# Patient Record
Sex: Male | Born: 1955 | Race: White | Hispanic: No | Marital: Married | Smoking: Current some day smoker
Health system: Southern US, Community
[De-identification: ages and names within clinical notes are randomized; demographics above are authoritative.]

## PROBLEM LIST (undated history)

## (undated) DIAGNOSIS — I10 Essential (primary) hypertension: Secondary | ICD-10-CM

## (undated) DIAGNOSIS — R7301 Impaired fasting glucose: Secondary | ICD-10-CM

## (undated) DIAGNOSIS — F419 Anxiety disorder, unspecified: Secondary | ICD-10-CM

## (undated) DIAGNOSIS — F329 Major depressive disorder, single episode, unspecified: Secondary | ICD-10-CM

## (undated) DIAGNOSIS — M1990 Unspecified osteoarthritis, unspecified site: Secondary | ICD-10-CM

## (undated) DIAGNOSIS — G2581 Restless legs syndrome: Secondary | ICD-10-CM

## (undated) DIAGNOSIS — R12 Heartburn: Secondary | ICD-10-CM

## (undated) DIAGNOSIS — Z87442 Personal history of urinary calculi: Secondary | ICD-10-CM

## (undated) DIAGNOSIS — K219 Gastro-esophageal reflux disease without esophagitis: Secondary | ICD-10-CM

## (undated) DIAGNOSIS — K635 Polyp of colon: Secondary | ICD-10-CM

## (undated) DIAGNOSIS — F32A Depression, unspecified: Secondary | ICD-10-CM

## (undated) DIAGNOSIS — R42 Dizziness and giddiness: Secondary | ICD-10-CM

## (undated) HISTORY — DX: Depression, unspecified: F32.A

## (undated) HISTORY — DX: Restless legs syndrome: G25.81

## (undated) HISTORY — DX: Essential (primary) hypertension: I10

## (undated) HISTORY — DX: Unspecified osteoarthritis, unspecified site: M19.90

## (undated) HISTORY — DX: Major depressive disorder, single episode, unspecified: F32.9

## (undated) HISTORY — DX: Polyp of colon: K63.5

## (undated) HISTORY — DX: Personal history of urinary calculi: Z87.442

## (undated) HISTORY — DX: Heartburn: R12

## (undated) HISTORY — DX: Gastro-esophageal reflux disease without esophagitis: K21.9

## (undated) HISTORY — DX: Impaired fasting glucose: R73.01

## (undated) HISTORY — DX: Anxiety disorder, unspecified: F41.9

---

## 2010-04-29 ENCOUNTER — Emergency Department: Payer: Self-pay | Admitting: Emergency Medicine

## 2010-07-01 ENCOUNTER — Emergency Department: Payer: Self-pay | Admitting: Internal Medicine

## 2010-07-20 ENCOUNTER — Ambulatory Visit: Payer: Self-pay | Admitting: Gastroenterology

## 2010-07-24 LAB — PATHOLOGY REPORT
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
## 2010-10-29 HISTORY — PX: UPPER GI ENDOSCOPY: SHX6162

## 2010-10-29 HISTORY — PX: COLONOSCOPY: SHX174

## 2015-01-19 ENCOUNTER — Encounter: Payer: Self-pay | Admitting: *Deleted

## 2015-01-27 ENCOUNTER — Ambulatory Visit (INDEPENDENT_AMBULATORY_CARE_PROVIDER_SITE_OTHER): Payer: BC Managed Care – PPO | Admitting: General Surgery

## 2015-01-27 ENCOUNTER — Encounter: Payer: Self-pay | Admitting: General Surgery

## 2015-01-27 VITALS — BP 162/88 | HR 88 | Resp 16 | Ht 73.0 in | Wt 194.0 lb

## 2015-01-27 DIAGNOSIS — K4090 Unilateral inguinal hernia, without obstruction or gangrene, not specified as recurrent: Secondary | ICD-10-CM | POA: Diagnosis not present

## 2015-01-27 NOTE — Progress Notes (Signed)
Patient ID: Philip Lynch, male   DOB: 1956/10/24, 59 y.o.   MRN: [id number]  Chief Complaint  Patient presents with  . Hernia    HPI Philip Lynch is a 59 y.o. male.  Here today for evaluation of a possible right inguinal hernia. He states he does a lot of heavy lifting and he has noticed a bulge for about 3-4 months. It just seems to be a little more tender and uncomfortable. He does feel a "pulling" sensation in the right lower abdomen. No nausea or vomiting.  HPI  Past Medical History  Diagnosis Date  . Colon polyp   . Arthritis   . Hypertension     Past Surgical History  Procedure Laterality Date  . Colonoscopy  2012    Dr. Tobias Alexander  . Upper gi endoscopy  2012    Family History  Problem Relation Age of Onset  . COPD Mother   . Heart attack Father     Social History History  Substance Use Topics  . Smoking status: Current Every Day Smoker -- 1.00 packs/day for 30 years    Types: Cigarettes  . Smokeless tobacco: Never Used  . Alcohol Use: No    No Known Allergies  Current Outpatient Prescriptions  Medication Sig Dispense Refill  . losartan (COZAAR) 100 MG tablet Take 100 mg by mouth daily.  6  . omeprazole (PRILOSEC) 20 MG capsule Take 20 mg by mouth daily.  6   No current facility-administered medications for this visit.    Review of Systems Review of Systems  Constitutional: Negative.   Respiratory: Negative.   Cardiovascular: Negative.     Blood pressure 162/88, pulse 88, resp. rate 16, height 6\' 1"  (1.854 m), weight 194 lb (87.998 kg).  Physical Exam Physical Exam  Constitutional: He is oriented to person, place, and time. He appears well-developed and well-nourished.  Eyes: Conjunctivae are normal. No scleral icterus.  Neck: Neck supple.  Cardiovascular: Normal rate, regular rhythm and normal heart sounds.   Pulmonary/Chest: Effort normal and breath sounds normal.  Abdominal: Soft. Normal appearance and bowel sounds are normal. A hernia is  present. Hernia confirmed positive in the right inguinal area.  Lymphadenopathy:    He has no cervical adenopathy.  Neurological: He is alert and oriented to person, place, and time.  Skin: Skin is warm and dry.    Data Reviewed none  Assessment    Right inguinal hernia. There is subtle fullness in left groin-not sure it is a hernia    Plan    Hernia precautions and incarceration were discussed with the patient. If they develop symptoms of an incarcerated hernia, they were encouraged to seek prompt medical attention.  I have recommended repair of the hernia using mesh on an outpatient basis in the near future. The risk of infection was reviewed. The role of prosthetic mesh to minimize the risk of recurrence was reviewed.    Discussed open and laparoscopic technique . Feel laparoscopy may help assess the left side also. Pt is agreeable. PCP:  Lydia Guiles G 01/27/2015, 10:00 AM

## 2015-01-27 NOTE — Patient Instructions (Addendum)
The patient is aware to call back for any questions or concerns. Inguinal Hernia, Adult Muscles help keep everything in the body in its proper place. But if a weak spot in the muscles develops, something can poke through. That is called a hernia. When this happens in the lower part of the belly (abdomen), it is called an inguinal hernia. (It takes its name from a part of the body in this region called the inguinal canal.) A weak spot in the wall of muscles lets some fat or part of the small intestine bulge through. An inguinal hernia can develop at any age. Men get them more often than women. CAUSES  In adults, an inguinal hernia develops over time.  It can be triggered by:  Suddenly straining the muscles of the lower abdomen.  Lifting heavy objects.  Straining to have a bowel movement. Difficult bowel movements (constipation) can lead to this.  Constant coughing. This may be caused by smoking or lung disease.  Being overweight.  Being pregnant.  Working at a job that requires long periods of standing or heavy lifting.  Having had an inguinal hernia before. One type can be an emergency situation. It is called a strangulated inguinal hernia. It develops if part of the small intestine slips through the weak spot and cannot get back into the abdomen. The blood supply can be cut off. If that happens, part of the intestine may die. This situation requires emergency surgery. SYMPTOMS  Often, a small inguinal hernia has no symptoms. It is found when a healthcare provider does a physical exam. Larger hernias usually have symptoms.   In adults, symptoms may include:  A lump in the groin. This is easier to see when the person is standing. It might disappear when lying down.  In men, a lump in the scrotum.  Pain or burning in the groin. This occurs especially when lifting, straining or coughing.  A dull ache or feeling of pressure in the groin.  Signs of a strangulated hernia can  include:  A bulge in the groin that becomes very painful and tender to the touch.  A bulge that turns red or purple.  Fever, nausea and vomiting.  Inability to have a bowel movement or to pass gas. DIAGNOSIS  To decide if you have an inguinal hernia, a healthcare provider will probably do a physical examination.  This will include asking questions about any symptoms you have noticed.  The healthcare provider might feel the groin area and ask you to cough. If an inguinal hernia is felt, the healthcare provider may try to slide it back into the abdomen.  Usually no other tests are needed. TREATMENT  Treatments can vary. The size of the hernia makes a difference. Options include:  Watchful waiting. This is often suggested if the hernia is small and you have had no symptoms.  No medical procedure will be done unless symptoms develop.  You will need to watch closely for symptoms. If any occur, contact your healthcare provider right away.  Surgery. This is used if the hernia is larger or you have symptoms.  Open surgery. This is usually an outpatient procedure (you will not stay overnight in a hospital). An cut (incision) is made through the skin in the groin. The hernia is put back inside the abdomen. The weak area in the muscles is then repaired by herniorrhaphy or hernioplasty. Herniorrhaphy: in this type of surgery, the weak muscles are sewn back together. Hernioplasty: a patch or mesh is   is used to close the weak area in the abdominal wall.  Laparoscopy. In this procedure, a surgeon makes small incisions. A thin tube with a tiny video camera (called a laparoscope) is put into the abdomen. The surgeon repairs the hernia with mesh by looking with the video camera and using two long instruments. HOME CARE INSTRUCTIONS   After surgery to repair an inguinal hernia:  You will need to take pain medicine prescribed by your healthcare provider. Follow all directions carefully.  You will need  to take care of the wound from the incision.  Your activity will be restricted for awhile. This will probably include no heavy lifting for several weeks. You also should not do anything too active for a few weeks. When you can return to work will depend on the type of job that you have.  During "watchful waiting" periods, you should:  Maintain a healthy weight.  Eat a diet high in fiber (fruits, vegetables and whole grains).  Drink plenty of fluids to avoid constipation. This means drinking enough water and other liquids to keep your urine clear or pale yellow.  Do not lift heavy objects.  Do not stand for long periods of time.  Quit smoking. This should keep you from developing a frequent cough. SEEK MEDICAL CARE IF:   A bulge develops in your groin area.  You feel pain, a burning sensation or pressure in the groin. This might be worse if you are lifting or straining.  You develop a fever of more than 100.5 F (38.1 C). SEEK IMMEDIATE MEDICAL CARE IF:   Pain in the groin increases suddenly.  A bulge in the groin gets bigger suddenly and does not go down.  For men, there is sudden pain in the scrotum. Or, the size of the scrotum increases.  A bulge in the groin area becomes red or purple and is painful to touch.  You have nausea or vomiting that does not go away.  You feel your heart beating much faster than normal.  You cannot have a bowel movement or pass gas.  You develop a fever of more than 102.0 F (38.9 C). Document Released: 03/03/2009 Document Revised: 01/07/2012 Document Reviewed: 03/03/2009 [facility] Patient Information 2015 [place], [place]. This information is not intended to replace advice given to you by your health care provider. Make sure you discuss any questions you have with your health care provider.  Patient's surgery has been scheduled for 02-03-15 at [facility].

## 2015-01-28 LAB — CBC WITH DIFFERENTIAL/PLATELET
BASOS: 1 %
Basophils Absolute: 0.1 10*3/uL (ref 0.0–0.2)
EOS: 1 %
Eosinophils Absolute: 0.1 10*3/uL (ref 0.0–0.4)
HCT: 44.6 % (ref 37.5–51.0)
Hemoglobin: 15.5 g/dL (ref 12.6–17.7)
IMMATURE GRANS (ABS): 0 10*3/uL (ref 0.0–0.1)
Immature Granulocytes: 0 %
LYMPHS: 20 %
Lymphocytes Absolute: 2.1 10*3/uL (ref 0.7–3.1)
MCH: 30.8 pg (ref 26.6–33.0)
MCHC: 34.8 g/dL (ref 31.5–35.7)
MCV: 89 fL (ref 79–97)
MONOCYTES: 8 %
Monocytes Absolute: 0.9 10*3/uL (ref 0.1–0.9)
NEUTROS PCT: 70 %
Neutrophils Absolute: 7.4 10*3/uL — ABNORMAL HIGH (ref 1.4–7.0)
Platelets: 303 10*3/uL (ref 150–379)
RBC: 5.03 x10E6/uL (ref 4.14–5.80)
RDW: 13.4 % (ref 12.3–15.4)
WBC: 10.5 10*3/uL (ref 3.4–10.8)

## 2015-01-28 LAB — BASIC METABOLIC PANEL
BUN/Creatinine Ratio: 14 (ref 9–20)
BUN: 14 mg/dL (ref 6–24)
CHLORIDE: 100 mmol/L (ref 97–108)
CO2: 24 mmol/L (ref 18–29)
CREATININE: 0.98 mg/dL (ref 0.76–1.27)
Calcium: 10 mg/dL (ref 8.7–10.2)
GFR calc non Af Amer: 84 mL/min/{1.73_m2} (ref >59)
GFR, EST AFRICAN AMERICAN: 97 mL/min/{1.73_m2} (ref >59)
GLUCOSE: 109 mg/dL — AB (ref 65–99)
Potassium: 4.9 mmol/L (ref 3.5–5.2)
Sodium: 139 mmol/L (ref 134–144)

## 2015-01-30 ENCOUNTER — Telehealth: Payer: Self-pay | Admitting: General Surgery

## 2015-01-30 NOTE — Telephone Encounter (Signed)
The patient was seen on 01/27/2015 in regards to a suspected right inguinal hernia. Surgery is scheduled for April 7.  The patient called this evening reporting a little bit of soreness in the area and perhaps a little bit of swelling. He had had a fairly quiet day.  He denied any difficulty with bowel or bladder function and no nausea vomiting or pain.  Unless symptoms change, he may plan on presenting for elective hernia repair as previously scheduled. Should he develop new symptoms he was encouraged to call for assessment.

## 2015-01-31 ENCOUNTER — Encounter: Payer: Self-pay | Admitting: General Surgery

## 2015-01-31 ENCOUNTER — Ambulatory Visit
Admit: 2015-01-31 | Disposition: A | Discharge status: Home / Self Care | Payer: Self-pay | Attending: General Surgery | Admitting: General Surgery

## 2015-01-31 ENCOUNTER — Ambulatory Visit (INDEPENDENT_AMBULATORY_CARE_PROVIDER_SITE_OTHER): Payer: BC Managed Care – PPO | Admitting: General Surgery

## 2015-01-31 VITALS — BP 140/80 | HR 76 | Resp 14 | Ht 73.0 in | Wt 191.0 lb

## 2015-01-31 DIAGNOSIS — K4090 Unilateral inguinal hernia, without obstruction or gangrene, not specified as recurrent: Secondary | ICD-10-CM | POA: Diagnosis not present

## 2015-01-31 LAB — CBC WITH DIFFERENTIAL/PLATELET
BASOS ABS: 0.1 10*3/uL (ref 0.0–0.1)
Basophil %: 0.8 %
EOS ABS: 0.1 10*3/uL (ref 0.0–0.7)
Eosinophil %: 0.5 %
HCT: 46.4 % (ref 40.0–52.0)
HGB: 15.3 g/dL (ref 13.0–18.0)
LYMPHS PCT: 13.3 %
Lymphocyte #: 1.7 10*3/uL (ref 1.0–3.6)
MCH: 30 pg (ref 26.0–34.0)
MCHC: 32.9 g/dL (ref 32.0–36.0)
MCV: 91 fL (ref 80–100)
Monocyte #: 1 x10 3/mm (ref 0.2–1.0)
Monocyte %: 7.7 %
NEUTROS PCT: 77.7 %
Neutrophil #: 9.7 10*3/uL — ABNORMAL HIGH (ref 1.4–6.5)
Platelet: 288 10*3/uL (ref 150–440)
RBC: 5.1 10*6/uL (ref 4.40–5.90)
RDW: 12.5 % (ref 11.5–14.5)
WBC: 12.5 10*3/uL — AB (ref 3.8–10.6)

## 2015-01-31 NOTE — Progress Notes (Signed)
Patient ID: Philip Lynch, male   DOB: [birth year], 59 y.o.   MRN: [id number]  Chief Complaint  Patient presents with  . Follow-up    swelling at right inguinal hernia site    HPI Philip Lynch is a 59 y.o. male  Patient here due to swelling at his right inguinal hernia site. He is scheduled for surgical repair of this area on 02/03/15. He states that he felt some swelling in the area last night. He called and was told to put ice on the area and rest. Today he reports nausea, light headedness and sweating. He reports no fevers and is using the bathroom regularly. He does report pain and cramping in the area today.   HPI  Past Medical History  Diagnosis Date  . Colon polyp   . Arthritis   . Hypertension     Past Surgical History  Procedure Laterality Date  . Colonoscopy  2012    Dr. Tobias Alexander  . Upper gi endoscopy  2012    Family History  Problem Relation Age of Onset  . COPD Mother   . Heart attack Father     Social History History  Substance Use Topics  . Smoking status: Current Every Day Smoker -- 1.00 packs/day for 30 years    Types: Cigarettes  . Smokeless tobacco: Never Used  . Alcohol Use: No    No Known Allergies  Current Outpatient Prescriptions  Medication Sig Dispense Refill  . ibuprofen (ADVIL,MOTRIN) 200 MG tablet Take 200 mg by mouth every 6 (six) hours as needed.    Marland Kitchen losartan (COZAAR) 100 MG tablet Take 100 mg by mouth daily.  6  . omeprazole (PRILOSEC) 20 MG capsule Take 20 mg by mouth daily.  6   No current facility-administered medications for this visit.    Review of Systems Review of Systems  Constitutional: Negative.   Respiratory: Negative.   Cardiovascular: Negative.   Gastrointestinal: Negative.     Blood pressure 140/80, pulse 76, resp. rate 14, height 6\' 1"  (1.854 m), weight 191 lb (86.637 kg).  Physical Exam Physical Exam  Constitutional: He is oriented to person, place, and time. He appears well-developed and well-nourished.   Eyes: Conjunctivae are normal. No scleral icterus.  Neck: Neck supple.  Cardiovascular: Normal rate, regular rhythm and normal heart sounds.   Pulmonary/Chest: Effort normal and breath sounds normal.  Abdominal: Soft. Bowel sounds are normal. He exhibits distension (mildly distended with minimal tympany ). There is tenderness (right groin). A hernia is present. Hernia confirmed positive in the right inguinal area (unchanged from previous evaluation).  Neurological: He is alert and oriented to person, place, and time.    Data Reviewed Prior note  Assessment    Right inguinal hernia possible involvement of bowel.    Plan    Check labs and xray abdomen. Further management based on these results.     WBC is 12.5K, rest normal. Abdominal xray- no small or large bowel distension. Moderate amount of stool in colon. Since pt reports more pain in right inguinal area will plan to proceed with repair tomorrow. Pt is agreeable.  Advised to use couple of fleets enemas today and eat very light. Cal if pain worsens or he develops any more abd discomfort.   Jaedan Schuman G 01/31/2015, 4:34 PM

## 2015-01-31 NOTE — Patient Instructions (Signed)
We will call you with results. Nothing to eat or drink until you here from [place].

## 2015-01-31 NOTE — Progress Notes (Deleted)
Patient ID: Philip Lynch, male   DOB: [birth year], 59 y.o.   MRN: [id number] Patient here due to swelling at his right inguinal hernia site. He is scheduled for surgical repair of this area on 02/03/15. He states that he felt some swelling in the area last night. He called and was told to put ice on the area and rest. Today he reports nausea, light headedness and sweating. He reports no fevers and is using the bathroom regularly. He does report pain and cramping in the area today.

## 2015-02-01 ENCOUNTER — Ambulatory Visit
Admit: 2015-02-01 | Disposition: A | Discharge status: Home / Self Care | Payer: Self-pay | Attending: General Surgery | Admitting: General Surgery

## 2015-02-01 ENCOUNTER — Telehealth: Payer: Self-pay

## 2015-02-01 DIAGNOSIS — K4090 Unilateral inguinal hernia, without obstruction or gangrene, not specified as recurrent: Secondary | ICD-10-CM | POA: Diagnosis not present

## 2015-02-01 HISTORY — PX: HERNIA REPAIR: SHX51

## 2015-02-01 NOTE — Telephone Encounter (Signed)
Patient called concerned because he just urinated after his surgery today and had some stinging while urinating. He states that the stinging stopped when he was done and that he was just concerned if this was to be expected or not. I let him know that he had a urinary catheter placed for surgery and that he may have some mild discomfort with urination for a short while. Patient instructed to drink plenty of fluids and monitor his urination. He was instructed to call if the symptoms don't clear up by morning or if they get worse. Patient expresses understanding and is agreeable to this.

## 2015-02-02 ENCOUNTER — Encounter: Payer: Self-pay | Admitting: General Surgery

## 2015-02-04 ENCOUNTER — Telehealth: Payer: Self-pay

## 2015-02-04 NOTE — Telephone Encounter (Signed)
Patient called and states that he has a lot of gas and is still constipated. He states that he is no longer taking his Percocet. I advised him to take Milk of Magnesia, one dose today and then one dose tomorrow if needed. If he does not have any positive results from this he is to call and inform us. Patient expresses understanding.

## 2015-02-07 ENCOUNTER — Encounter: Payer: Self-pay | Admitting: General Surgery

## 2015-02-10 ENCOUNTER — Encounter: Payer: Self-pay | Admitting: General Surgery

## 2015-02-10 ENCOUNTER — Ambulatory Visit (INDEPENDENT_AMBULATORY_CARE_PROVIDER_SITE_OTHER): Payer: BC Managed Care – PPO | Admitting: General Surgery

## 2015-02-10 VITALS — BP 140/68 | HR 76 | Resp 12 | Ht 73.0 in | Wt 187.0 lb

## 2015-02-10 DIAGNOSIS — K4090 Unilateral inguinal hernia, without obstruction or gangrene, not specified as recurrent: Secondary | ICD-10-CM

## 2015-02-10 NOTE — Patient Instructions (Signed)
Patient to return in one month. 

## 2015-02-10 NOTE — Progress Notes (Signed)
This is a 59 year old male here today for his post op right inguinal hernia repair done on 02/01/15. Patient states he is doing well.  Right inguinal hernia repair intact and healing well. Abdomen is soft . Patient to return in one month.

## 2015-02-11 ENCOUNTER — Encounter: Payer: Self-pay | Admitting: General Surgery

## 2015-02-27 NOTE — Op Note (Signed)
PATIENT NAME:  Philip Lynch, Kingsly L MR#:  [id number] DATE OF BIRTH:  October 14, 1956  DATE OF PROCEDURE:  02/01/2015  PREOPERATIVE DIAGNOSIS: Right inguinal hernia.   POSTOPERATIVE DIAGNOSIS: Right inguinal direct hernia.   OPERATION PERFORMED: Laparoscopy and repair of right inguinal hernia.   ANESTHESIA: General.   COMPLICATIONS: None.   ESTIMATED BLOOD LOSS: Minimal, less than 20 mL.   DRAINS: None.   DESCRIPTION OF PROCEDURE: The patient was put to sleep in the supine position on the operating table. Foley catheter was inserted, which was removed at the end of the procedure. The abdomen was prepped and draped out as a sterile field and a timeout was performed. Initial port incision was made just above the umbilicus with a small incision and a Veress needle with the InnerDyne sleeve was positioned within the peritoneal cavity, verified with the hanging drop method. Pneumoperitoneum was obtained and a 10 mm port was placed. An angled scope camera was introduced and a left lateral 5 mm port and right lateral 12 mm ports were placed. The patient was placed in slight Trendelenburg and the bowel was displaced from the pelvic and lower abdominal area to reveal the inguinal regions. No hernia noted on the left. On the right side, the patient had a well-defined circular 2 cm hernial opening located on the medial portion of the inguinal canal into which the appendix was herniating. The appendix we easily reduced back to the peritoneal cavity and noted to be entirely normal. The peritoneum overlying the inguinal canal and superior to the inguinal canal was incised with cautery and then further dissection was performed to expose the posterior wall of the inguinal canal thereby reducing the hernia protrusion of peritoneum in the posterior wall. The fatty tissue surrounding this was also reduced leaving a large defect in the posterior wall medial to the inferior epigastric vessel. In the course of dissection, a small  branch coming from the inferior epigastric was noted to be bleeding. This was Hemoclipped and controlled easily. The pubic tubercle and the inguinal ligament both medial and lateral to the internal ring area were satisfactorily exposed. The contents in the internal ring were identified and peeled away to ensure there was no other abnormality at this site. After the dissection was complete, the posterior wall was well displayed. A medium-sized Bard 3-D Max mesh was then brought up. It was placed into peritoneal cavity and then placed up against the posterior wall of the inguinal canal. After satisfactory positioning, it was tacked to the pubic tubercle and the inguinal ligament below with a secure strap. It was also tacked in a few places medially and in the superior aspect with the lateral ends left long. Mesh was noted to be adequately covering the posterior wall and the internal ring area. The peritoneum was then reapproximated with also a secure strap to cover the mesh completely. The fascial opening of the umbilicus in the right side was closed with 0 Vicryl stitches and pneumoperitoneum was released through the left lateral port, which was then subsequently removed. Skin incisions were closed with subcuticular 4-0 Vicryl, covered with LiquiBand. The procedure was well tolerated. He was subsequently extubated and returned to the recovery room in stable condition.    ____________________________ S.Robinette Haines, MD sgs:at D: 02/01/2015 12:50:10 ET T: 02/01/2015 13:21:49 ET JOB#: [id number]  cc: S.G. Jamal Collin, MD, <Dictator> [facility] Robinette Haines MD ELECTRONICALLY SIGNED 02/01/2015 15:12

## 2015-03-10 ENCOUNTER — Ambulatory Visit (INDEPENDENT_AMBULATORY_CARE_PROVIDER_SITE_OTHER): Payer: BC Managed Care – PPO | Admitting: General Surgery

## 2015-03-10 ENCOUNTER — Encounter: Payer: Self-pay | Admitting: General Surgery

## 2015-03-10 VITALS — BP 128/84 | HR 80 | Resp 12 | Ht 73.0 in | Wt 187.0 lb

## 2015-03-10 DIAGNOSIS — K4090 Unilateral inguinal hernia, without obstruction or gangrene, not specified as recurrent: Secondary | ICD-10-CM

## 2015-03-10 NOTE — Progress Notes (Signed)
Here today for postoperative visit, laparoscopic hernia repair on 02-01-15. States he is doing well. Bowels moving regular Minimal pain described as "soreness". Only trouble is his back and wants to go to chiropractor.  Port sites are clean and healing well. Abdomen is soft. No hernia palpated.  May return to activities without restrictions next week.  Proper lifting techniques reviewed. Follow up as needed.

## 2015-03-10 NOTE — Patient Instructions (Addendum)
The patient is aware to call back for any questions or concerns. May return to activities without restrictions next week.  Proper lifting techniques reviewed.

## 2015-03-21 ENCOUNTER — Other Ambulatory Visit: Payer: Self-pay | Admitting: Internal Medicine

## 2015-03-21 DIAGNOSIS — R1011 Right upper quadrant pain: Secondary | ICD-10-CM

## 2015-03-25 ENCOUNTER — Ambulatory Visit: Payer: Self-pay

## 2015-03-29 ENCOUNTER — Other Ambulatory Visit: Payer: Self-pay | Admitting: Internal Medicine

## 2015-03-30 ENCOUNTER — Other Ambulatory Visit: Payer: Self-pay | Admitting: Internal Medicine

## 2015-03-30 DIAGNOSIS — R1011 Right upper quadrant pain: Secondary | ICD-10-CM

## 2015-03-30 DIAGNOSIS — Z8719 Personal history of other diseases of the digestive system: Secondary | ICD-10-CM

## 2015-03-30 DIAGNOSIS — Z9889 Other specified postprocedural states: Secondary | ICD-10-CM

## 2015-04-05 ENCOUNTER — Ambulatory Visit: Payer: Self-pay

## 2015-04-26 ENCOUNTER — Encounter: Payer: Self-pay | Admitting: Emergency Medicine

## 2015-04-26 ENCOUNTER — Emergency Department
Admission: EM | Admit: 2015-04-26 | Discharge: 2015-04-26 | Disposition: A | Discharge status: Home / Self Care | Payer: BC Managed Care – PPO | Attending: Emergency Medicine | Admitting: Emergency Medicine

## 2015-04-26 DIAGNOSIS — I10 Essential (primary) hypertension: Secondary | ICD-10-CM | POA: Diagnosis not present

## 2015-04-26 DIAGNOSIS — R42 Dizziness and giddiness: Secondary | ICD-10-CM | POA: Insufficient documentation

## 2015-04-26 DIAGNOSIS — Z79899 Other long term (current) drug therapy: Secondary | ICD-10-CM | POA: Diagnosis not present

## 2015-04-26 DIAGNOSIS — Z720 Tobacco use: Secondary | ICD-10-CM | POA: Diagnosis not present

## 2015-04-26 DIAGNOSIS — Z7951 Long term (current) use of inhaled steroids: Secondary | ICD-10-CM | POA: Insufficient documentation

## 2015-04-26 LAB — BASIC METABOLIC PANEL
ANION GAP: 6 (ref 5–15)
BUN: 14 mg/dL (ref 6–20)
CALCIUM: 9.5 mg/dL (ref 8.9–10.3)
CO2: 27 mmol/L (ref 22–32)
CREATININE: 0.8 mg/dL (ref 0.61–1.24)
Chloride: 106 mmol/L (ref 101–111)
GFR calc Af Amer: >60 mL/min (ref >60)
Glucose, Bld: 115 mg/dL — ABNORMAL HIGH (ref 65–99)
Potassium: 3.8 mmol/L (ref 3.5–5.1)
Sodium: 139 mmol/L (ref 135–145)

## 2015-04-26 LAB — CBC WITH DIFFERENTIAL/PLATELET
BASOS ABS: 0.1 10*3/uL (ref 0–0.1)
Basophils Relative: 2 %
EOS ABS: 0.2 10*3/uL (ref 0–0.7)
Eosinophils Relative: 2 %
HCT: 44 % (ref 40.0–52.0)
Hemoglobin: 14.9 g/dL (ref 13.0–18.0)
Lymphocytes Relative: 21 %
Lymphs Abs: 1.8 10*3/uL (ref 1.0–3.6)
MCH: 30.7 pg (ref 26.0–34.0)
MCHC: 33.8 g/dL (ref 32.0–36.0)
MCV: 90.8 fL (ref 80.0–100.0)
Monocytes Absolute: 0.8 10*3/uL (ref 0.2–1.0)
Monocytes Relative: 9 %
NEUTROS ABS: 5.8 10*3/uL (ref 1.4–6.5)
Neutrophils Relative %: 66 %
PLATELETS: 316 10*3/uL (ref 150–440)
RBC: 4.85 MIL/uL (ref 4.40–5.90)
RDW: 12.8 % (ref 11.5–14.5)
WBC: 8.7 10*3/uL (ref 3.8–10.6)

## 2015-04-26 LAB — TROPONIN I

## 2015-04-26 MED ORDER — MECLIZINE HCL 25 MG PO TABS
25.0000 mg | ORAL_TABLET | Freq: Once | ORAL | Status: AC
  Administered 2015-04-26: 25 mg via ORAL

## 2015-04-26 MED ORDER — DEXAMETHASONE SODIUM PHOSPHATE 10 MG/ML IJ SOLN
INTRAMUSCULAR | Status: AC
  Filled 2015-04-26: qty 1

## 2015-04-26 MED ORDER — MECLIZINE HCL 25 MG PO TABS
ORAL_TABLET | ORAL | Status: AC
  Filled 2015-04-26: qty 1

## 2015-04-26 MED ORDER — MECLIZINE HCL 25 MG PO TABS: 25.0000 mg | ORAL_TABLET | Freq: Three times a day (TID) | ORAL | Status: DC | PRN

## 2015-04-26 MED ORDER — DEXAMETHASONE SODIUM PHOSPHATE 10 MG/ML IJ SOLN
10.0000 mg | Freq: Once | INTRAMUSCULAR | Status: AC
  Administered 2015-04-26: 10 mg via INTRAVENOUS

## 2015-04-26 NOTE — ED Notes (Signed)
Pt presents with dizziness for the past couple of weeks. Pt c/o fatigue at times, c/o left sided facial twitching.  Pt had hernia surgery about three months ago and has not felt "right" since the surgery. Pt denies any chest pain or shortness of breath at this time. edp at bedside.  Pt does reports some feelings of discomfort on the right side lower quad.  vss.

## 2015-04-26 NOTE — ED Provider Notes (Signed)
[facility] Emergency Department Provider Note  ____________________________________________  Time seen: 7:45 AM  I have reviewed the triage vital signs and the nursing notes.   HISTORY  Chief Complaint Dizziness    HPI Philip Lynch is a 59 y.o. male who complains of episodic dizziness for the past 2-3 weeks. He notes that over the last 4 weeks she's been treated with 2 courses of antibiotics for upper respiratory infection including azithromycin and amoxicillin. Over the last 2-3 weeks, he is having episodes of dizziness described as primarily lightheadedness with sometimes motion, the symptoms appear to be mainly orthostatic in nature, worse when he is upright. He denies any exertional symptoms such as chest pain shortness of breath or lightheadedness. However he has had 2 episodes of dizziness that were onset after walking on a treadmill or chasing his dog around the house.No syncope. No headache, numbness tingling or weakness anywhere in the body, vision changes or hearing changes. No fevers or chills.  He is eating and drinking normally, urinating also times a day without problems. The dizziness does not happen every day and when it does it lasts for a few minutes to a few hours.  He also noted some twitching in the left side of his face, which comes and goes fleetingly.   Past Medical History  Diagnosis Date  . Colon polyp   . Arthritis   . Hypertension     There are no active problems to display for this patient.   Past Surgical History  Procedure Laterality Date  . Colonoscopy  2012    Dr. Tobias Alexander  . Upper gi endoscopy  2012  . Hernia repair Right 02/01/15    inguinal     Current Outpatient Rx  Name  Route  Sig  Dispense  Refill  . ferrous sulfate 325 (65 FE) MG tablet   Oral   Take 325 mg by mouth daily.      10   . fluticasone (FLONASE) 50 MCG/ACT nasal spray      USE 1 SPRAY IN EACH NOSTRIL EVERY DAY      0   . ibuprofen  (ADVIL,MOTRIN) 200 MG tablet   Oral   Take 200 mg by mouth every 6 (six) hours as needed.         Marland Kitchen losartan (COZAAR) 100 MG tablet   Oral   Take 100 mg by mouth daily.      6   . omeprazole (PRILOSEC) 20 MG capsule   Oral   Take 20 mg by mouth daily.      6   . sertraline (ZOLOFT) 50 MG tablet   Oral   Take 50 mg by mouth daily.      5   . meclizine (ANTIVERT) 25 MG tablet   Oral   Take 1 tablet (25 mg total) by mouth 3 (three) times daily as needed for dizziness or nausea.   30 tablet   1     Allergies Review of patient's allergies indicates no known allergies.  Family History  Problem Relation Age of Onset  . COPD Mother   . Heart attack Father     Social History History  Substance Use Topics  . Smoking status: Current Every Day Smoker -- 1.00 packs/day for 30 years    Types: Cigarettes  . Smokeless tobacco: Never Used  . Alcohol Use: No    Review of Systems  Constitutional: No fever or chills. No weight changes Eyes:No blurry vision or double  vision.  ENT: No sore throat. Cardiovascular: No chest pain. Respiratory: No dyspnea or cough. Gastrointestinal: Negative for abdominal pain, vomiting and diarrhea.  No BRBPR or melena. Genitourinary: Negative for dysuria, urinary retention, bloody urine, or difficulty urinating. Musculoskeletal: Negative for back pain. No joint swelling or pain. Skin: Negative for rash. Neurological: Negative for headaches, focal weakness or numbness. Positive dizziness as above Psychiatric:No anxiety or depression.   Endocrine:No hot/cold intolerance, changes in energy, or sleep difficulty.  10-point ROS otherwise negative.  ____________________________________________   PHYSICAL EXAM:  VITAL SIGNS: ED Triage Vitals  Enc Vitals Group     BP 04/26/15 0739 159/77 mmHg     Pulse Rate 04/26/15 0739 73     Resp 04/26/15 0739 18     Temp 04/26/15 0739 97.8 F (36.6 C)     Temp Source 04/26/15 0739 Oral     SpO2  04/26/15 0739 99 %     Weight 04/26/15 0739 183 lb (83.008 kg)     Height 04/26/15 0739 6' (1.829 m)     Head Cir --      Peak Flow --      Pain Score 04/26/15 0739 0     Pain Loc --      Pain Edu? --      Excl. in [place]? --      Constitutional: Alert and oriented. Well appearing and in no distress. Eyes: No scleral icterus. No conjunctival pallor. PERRL. EOMI ENT   Head: Normocephalic and atraumatic. Dried cerumen in bilateral external canals. Bilateral TMs unremarkable   Nose: No congestion/rhinnorhea. No septal hematoma   Mouth/Throat: MMM, no pharyngeal erythema. No peritonsillar mass. No uvula shift.   Neck: No stridor. No SubQ emphysema. No meningismus. Hematological/Lymphatic/Immunilogical: No cervical lymphadenopathy. Cardiovascular: RRR. Normal and symmetric distal pulses are present in all extremities. No murmurs, rubs, or gallops. Respiratory: Normal respiratory effort without tachypnea nor retractions. Breath sounds are clear and equal bilaterally. No wheezes/rales/rhonchi. Gastrointestinal: Soft and nontender. No distention. There is no CVA tenderness.  No rebound, rigidity, or guarding. Genitourinary: deferred Musculoskeletal: Nontender with normal range of motion in all extremities. No joint effusions.  No lower extremity tenderness.  No edema. Neurologic:   Normal speech and language.  CN 2-10 normal. Motor grossly intact. No pronator drift.  Normal gait. No gross focal neurologic deficits are appreciated.  Skin:  Skin is warm, dry and intact. No rash noted.  No petechiae, purpura, or bullae. Psychiatric: Mood and affect are normal. Speech and behavior are normal. Patient exhibits appropriate insight and judgment.  ____________________________________________    LABS (pertinent positives/negatives) (all labs ordered are listed, but only abnormal results are displayed) Labs Reviewed  BASIC METABOLIC PANEL - Abnormal; Notable for the following:     Glucose, Bld 115 (*)    All other components within normal limits  CBC WITH DIFFERENTIAL/PLATELET  TROPONIN I   ____________________________________________   EKG  Interpreted by me  Date: 04/26/2015  Rate: 64  Rhythm: normal sinus rhythm  QRS Axis: normal  Intervals: normal  ST/T Wave abnormalities: normal  Conduction Disutrbances: none  Narrative Interpretation: unremarkable      ____________________________________________    RADIOLOGY    ____________________________________________   PROCEDURES  ____________________________________________   INITIAL IMPRESSION / ASSESSMENT AND PLAN / ED COURSE  Pertinent labs & imaging results that were available during my care of the patient were reviewed by me and considered in my medical decision making (see chart for details).  Patient presents with subacute episodic  dizziness, unlikely to be cardiopulmonary in nature, unlikely to be stroke or infectious. It is most likely related to labyrinthitis or other sequelae of recent upper respiratory infections. Patient is well-appearing no acute distress medically stable. We'll check labs including troponin. With normal EKG and very low risk symptoms for cardiac pathology, negative troponin would effectively rule out pathology in this area for now. ----------------------------------------- 11:52 AM on 04/26/2015 -----------------------------------------  Vital signs stable. Patient feeling better. Still awaiting chemistry results. I called the lab and they're able to verbally report the BMP results which are unremarkable. The report difficulty getting the analyzer to run the troponin and will attempt to re-run it.  ----------------------------------------- 1:33 PM on 04/26/2015 -----------------------------------------  After calling the lab again, they're able to report that the troponin is also negative. All labs unremarkable, after a 5+ hour wait for lab results.  Patient  feels well, tolerating oral intake. Drink 3 cups of water in the room. We'll continue to maintain good oral hydration, take meclizine as needed. Has follow-up planned with primary care tomorrow. Low suspicion for stroke, vertebrobasilar insufficiency, meningitis, tumor, intracranial hemorrhage. Dizziness appears peripheral in nature.   ____________________________________________   FINAL CLINICAL IMPRESSION(S) / ED DIAGNOSES  Final diagnoses:  Dizziness      Carrie Mew, MD 04/26/15 1335

## 2015-04-26 NOTE — ED Notes (Signed)
Pt to ed with c/o dizziness and weakness that started about 4 am when he awoke.  Pt states he has been feeling bad for about 4 months, reports he has seen his PMD but no answers for the dizziness and lightheadedness.  Pt also reports some "twitching" in left side of face intermittently x 1 week.

## 2015-04-26 NOTE — Discharge Instructions (Signed)

## 2015-04-27 ENCOUNTER — Ambulatory Visit: Payer: Self-pay | Admitting: Family Medicine

## 2015-04-28 ENCOUNTER — Encounter: Payer: Self-pay | Admitting: Family Medicine

## 2015-04-28 ENCOUNTER — Ambulatory Visit (INDEPENDENT_AMBULATORY_CARE_PROVIDER_SITE_OTHER): Payer: BC Managed Care – PPO | Admitting: Family Medicine

## 2015-04-28 VITALS — BP 162/85 | HR 79 | Temp 98.2°F | Ht 71.5 in | Wt 182.0 lb

## 2015-04-28 DIAGNOSIS — S32000S Wedge compression fracture of unspecified lumbar vertebra, sequela: Secondary | ICD-10-CM

## 2015-04-28 DIAGNOSIS — R251 Tremor, unspecified: Secondary | ICD-10-CM

## 2015-04-28 DIAGNOSIS — I10 Essential (primary) hypertension: Secondary | ICD-10-CM

## 2015-04-28 DIAGNOSIS — R739 Hyperglycemia, unspecified: Secondary | ICD-10-CM | POA: Diagnosis not present

## 2015-04-28 DIAGNOSIS — H6123 Impacted cerumen, bilateral: Secondary | ICD-10-CM | POA: Diagnosis not present

## 2015-04-28 DIAGNOSIS — R292 Abnormal reflex: Secondary | ICD-10-CM | POA: Diagnosis not present

## 2015-04-28 DIAGNOSIS — I517 Cardiomegaly: Secondary | ICD-10-CM | POA: Diagnosis not present

## 2015-04-28 DIAGNOSIS — G4761 Periodic limb movement disorder: Secondary | ICD-10-CM | POA: Diagnosis not present

## 2015-04-28 DIAGNOSIS — M40294 Other kyphosis, thoracic region: Secondary | ICD-10-CM

## 2015-04-28 DIAGNOSIS — G2581 Restless legs syndrome: Secondary | ICD-10-CM

## 2015-04-28 DIAGNOSIS — F064 Anxiety disorder due to known physiological condition: Secondary | ICD-10-CM

## 2015-04-28 NOTE — Assessment & Plan Note (Signed)
Well-controlled apart from today's visit; will continue current medicine; LVH on EKG reviewed and discussed; DASH guidelines
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
## 2015-04-28 NOTE — Patient Instructions (Addendum)
Your goal blood pressure is less than  140/90. Try to follow the DASH guidelines (DASH stands for Dietary Approaches to Stop Hypertension) Try to limit the sodium in your diet.  Ideally, consume less than 1.5 grams (less than 1,500mg ) per day. Do not add salt when cooking or at the table.  Check the sodium amount on labels when shopping, and choose items lower in sodium when given a choice. Avoid or limit foods that already contain a lot of sodium. Eat a diet rich in fruits and vegetables and whole grains. If you have not heard anything from my staff in a week about any orders/referrals/studies from today, please contact us here to follow-up [phone] Keep up updated about any changes in your symptoms [facility] staff --> request copy of lipids Start the sertraline and call if overdoing it with mood Stop the iron   DASH Eating Plan DASH stands for "Dietary Approaches to Stop Hypertension." The DASH eating plan is a healthy eating plan that has been shown to reduce high blood pressure (hypertension). Additional health benefits may include reducing the risk of type 2 diabetes mellitus, heart disease, and stroke. The DASH eating plan may also help with weight loss. WHAT DO I NEED TO KNOW ABOUT THE DASH EATING PLAN? For the DASH eating plan, you will follow these general guidelines:  Choose foods with a percent daily value for sodium of less than 5% (as listed on the food label).  Use salt-free seasonings or herbs instead of table salt or sea salt.  Check with your health care provider or pharmacist before using salt substitutes.  Eat lower-sodium products, often labeled as "lower sodium" or "no salt added."  Eat fresh foods.  Eat more vegetables, fruits, and low-fat dairy products.  Choose whole grains. Look for the word "whole" as the first word in the ingredient list.  Choose fish and skinless chicken or [place] more often than red meat. Limit fish, poultry, and meat to 6 oz (170 g) each  day.  Limit sweets, desserts, sugars, and sugary drinks.  Choose heart-healthy fats.  Limit cheese to 1 oz (28 g) per day.  Eat more home-cooked food and less restaurant, buffet, and fast food.  Limit fried foods.  Cook foods using methods other than frying.  Limit canned vegetables. If you do use them, rinse them well to decrease the sodium.  When eating at a restaurant, ask that your food be prepared with less salt, or no salt if possible. WHAT FOODS CAN I EAT? Seek help from a dietitian for individual calorie needs. Grains Whole grain or whole wheat bread. Brown rice. Whole grain or whole wheat pasta. Quinoa, bulgur, and whole grain cereals. Low-sodium cereals. Corn or whole wheat flour tortillas. Whole grain cornbread. Whole grain crackers. Low-sodium crackers. Vegetables Fresh or frozen vegetables (raw, steamed, roasted, or grilled). Low-sodium or reduced-sodium tomato and vegetable juices. Low-sodium or reduced-sodium tomato sauce and paste. Low-sodium or reduced-sodium canned vegetables.  Fruits All fresh, canned (in natural juice), or frozen fruits. Meat and Other Protein Products Ground beef (85% or leaner), grass-fed beef, or beef trimmed of fat. Skinless chicken or [place]. Ground chicken or [place]. Pork trimmed of fat. All fish and seafood. Eggs. Dried beans, peas, or lentils. Unsalted nuts and seeds. Unsalted canned beans. Dairy Low-fat dairy products, such as skim or 1% milk, 2% or reduced-fat cheeses, low-fat ricotta or cottage cheese, or plain low-fat yogurt. Low-sodium or reduced-sodium cheeses. Fats and Oils Tub margarines without trans fats. Light or  reduced-fat mayonnaise and salad dressings (reduced sodium). Avocado. Safflower, olive, or canola oils. Natural peanut or almond butter. Other Unsalted popcorn and pretzels. The items listed above may not be a complete list of recommended foods or beverages. Contact your dietitian for more options. WHAT FOODS ARE NOT  RECOMMENDED? Grains White bread. White pasta. White rice. Refined cornbread. Bagels and croissants. Crackers that contain trans fat. Vegetables Creamed or fried vegetables. Vegetables in a cheese sauce. Regular canned vegetables. Regular canned tomato sauce and paste. Regular tomato and vegetable juices. Fruits Dried fruits. Canned fruit in light or heavy syrup. Fruit juice. Meat and Other Protein Products Fatty cuts of meat. Ribs, chicken wings, bacon, sausage, bologna, salami, chitterlings, fatback, hot dogs, bratwurst, and packaged luncheon meats. Salted nuts and seeds. Canned beans with salt. Dairy Whole or 2% milk, cream, half-and-half, and cream cheese. Whole-fat or sweetened yogurt. Full-fat cheeses or blue cheese. Nondairy creamers and whipped toppings. Processed cheese, cheese spreads, or cheese curds. Condiments Onion and garlic salt, seasoned salt, table salt, and sea salt. Canned and packaged gravies. Worcestershire sauce. Tartar sauce. Barbecue sauce. Teriyaki sauce. Soy sauce, including reduced sodium. Steak sauce. Fish sauce. Oyster sauce. Cocktail sauce. Horseradish. Ketchup and mustard. Meat flavorings and tenderizers. Bouillon cubes. Hot sauce. Tabasco sauce. Marinades. Taco seasonings. Relishes. Fats and Oils Butter, stick margarine, lard, shortening, ghee, and bacon fat. Coconut, palm kernel, or palm oils. Regular salad dressings. Other Pickles and olives. Salted popcorn and pretzels. The items listed above may not be a complete list of foods and beverages to avoid. Contact your dietitian for more information. WHERE CAN I FIND MORE INFORMATION? National Heart, [employer], and [employer] Institute: travelstabloid.com Document Released: 10/04/2011 Document Revised: 03/01/2014 Document Reviewed: 08/19/2013 [facility] Patient Information 2015 [place], [place]. This information is not intended to replace advice given to you by your health care provider. Make  sure you discuss any questions you have with your health care provider.

## 2015-04-29 ENCOUNTER — Telehealth: Payer: Self-pay | Admitting: Family Medicine

## 2015-04-29 LAB — FERRITIN: FERRITIN: 46 ng/mL (ref 30–400)

## 2015-04-29 LAB — HEPATIC FUNCTION PANEL
ALK PHOS: 142 IU/L — AB (ref 39–117)
ALT: 19 IU/L (ref 0–44)
AST: 19 IU/L (ref 0–40)
Albumin: 4.3 g/dL (ref 3.5–5.5)
Bilirubin Total: 0.3 mg/dL (ref 0.0–1.2)
Bilirubin, Direct: 0.1 mg/dL (ref 0.00–0.40)
TOTAL PROTEIN: 6.8 g/dL (ref 6.0–8.5)

## 2015-04-29 LAB — TSH: TSH: 1.09 u[IU]/mL (ref 0.450–4.500)

## 2015-04-29 LAB — HGB A1C W/O EAG: Hgb A1c MFr Bld: 5.9 % — ABNORMAL HIGH (ref 4.8–5.6)

## 2015-04-29 LAB — VITAMIN D 25 HYDROXY (VIT D DEFICIENCY, FRACTURES): Vit D, 25-Hydroxy: 27.6 ng/mL — ABNORMAL LOW (ref 30.0–100.0)

## 2015-04-29 NOTE — Telephone Encounter (Signed)
Pt would like a call back, didn't say what it was just said he wanted a call back

## 2015-04-30 NOTE — Assessment & Plan Note (Signed)
Consider hypoglycemia and hyperthyroidism; checking labs today

## 2015-04-30 NOTE — Assessment & Plan Note (Signed)
Noted on sleep study; I don't think iron therapy is the ideal choice, given that his is a 59 year old male; will check ferritin level to make sure it's not indeed low, but consider other medication for this

## 2015-04-30 NOTE — Assessment & Plan Note (Signed)
Noted on EKG done in ER; reviewed with patient, explained consequence most likely of long-standing HTN, need to get that controlled

## 2015-04-30 NOTE — Assessment & Plan Note (Signed)
Check labs today; consider 3 hour GGT; may need to check insulin level as well (starting slow instead of ordering too many things all at once; will see what the TSH and A1C show too)

## 2015-04-30 NOTE — Assessment & Plan Note (Signed)
Check labs and consider medication for this

## 2015-05-02 ENCOUNTER — Encounter: Payer: Self-pay | Admitting: Family Medicine

## 2015-05-02 ENCOUNTER — Other Ambulatory Visit: Payer: Self-pay | Admitting: Family Medicine

## 2015-05-02 DIAGNOSIS — R748 Abnormal levels of other serum enzymes: Secondary | ICD-10-CM

## 2015-05-02 DIAGNOSIS — R7301 Impaired fasting glucose: Secondary | ICD-10-CM | POA: Insufficient documentation

## 2015-05-02 HISTORY — DX: Impaired fasting glucose: R73.01

## 2015-05-03 NOTE — Telephone Encounter (Signed)
Patient notified that the GGT is pending his lab results. Dr. Sanda Klein will review his labs and determine if a GTT is necessary.

## 2015-05-04 LAB — ALKALINE PHOSPHATASE, ISOENZYMES
BONE FRACTION: 36 % (ref 12–68)
INTESTINAL FRAC.: 8 % (ref 0–18)
LIVER FRACTION: 56 % (ref 13–88)

## 2015-05-04 LAB — SPECIMEN STATUS REPORT

## 2015-05-06 ENCOUNTER — Ambulatory Visit
Admission: RE | Admit: 2015-05-06 | Discharge: 2015-05-06 | Disposition: A | Discharge status: Home / Self Care | Payer: BC Managed Care – PPO | Source: Ambulatory Visit | Attending: Family Medicine | Admitting: Family Medicine

## 2015-05-06 DIAGNOSIS — R748 Abnormal levels of other serum enzymes: Secondary | ICD-10-CM | POA: Insufficient documentation

## 2015-05-27 ENCOUNTER — Ambulatory Visit (INDEPENDENT_AMBULATORY_CARE_PROVIDER_SITE_OTHER): Payer: BC Managed Care – PPO | Admitting: Family Medicine

## 2015-05-27 ENCOUNTER — Encounter: Payer: Self-pay | Admitting: Family Medicine

## 2015-05-27 VITALS — BP 149/80 | HR 68 | Temp 97.3°F | Wt 181.0 lb

## 2015-05-27 DIAGNOSIS — Z8349 Family history of other endocrine, nutritional and metabolic diseases: Secondary | ICD-10-CM

## 2015-05-27 DIAGNOSIS — G2581 Restless legs syndrome: Secondary | ICD-10-CM

## 2015-05-27 DIAGNOSIS — R5383 Other fatigue: Secondary | ICD-10-CM

## 2015-05-27 DIAGNOSIS — R7309 Other abnormal glucose: Secondary | ICD-10-CM | POA: Diagnosis not present

## 2015-05-27 DIAGNOSIS — I10 Essential (primary) hypertension: Secondary | ICD-10-CM

## 2015-05-27 DIAGNOSIS — Z720 Tobacco use: Secondary | ICD-10-CM | POA: Insufficient documentation

## 2015-05-27 DIAGNOSIS — R748 Abnormal levels of other serum enzymes: Secondary | ICD-10-CM | POA: Diagnosis not present

## 2015-05-27 MED ORDER — ROPINIROLE HCL 0.25 MG PO TABS
ORAL_TABLET | ORAL | Status: DC
Start: 2015-05-27 — End: 2015-08-31

## 2015-05-27 MED ORDER — SERTRALINE HCL 25 MG PO TABS: 25.0000 mg | ORAL_TABLET | Freq: Every day | ORAL | Status: DC

## 2015-05-27 NOTE — Assessment & Plan Note (Addendum)
Reviewed previous total plus isoenzymes with him; he was due for recheck nonfasting alk phos in early July; he'll return for that next week

## 2015-05-27 NOTE — Assessment & Plan Note (Signed)
Encouraged cessation.

## 2015-05-27 NOTE — Patient Instructions (Addendum)
Try to use PLAIN allergy medicine without the decongestant Avoid: phenylephrine, phenylpropanolamine, and pseudoephredine I'll suggest skipping the omeprazole occasionally, maybe take it every other day or skip one or two days a week Caution: prolonged use of proton pump inhibitors like omeprazole (Prilosec), pantoprazole (Protonix), esomeprazole (Nexium), and others like Dexilant and Aciphex may increase your risk of pneumonia, Clostridium difficile colitis, osteoporosis, anemia and other health complications Try to limit or avoid triggers like coffee, caffeinated beverages, onions, chocolate, spicy foods, peppermint, acid foods like pizza, spaghetti sauce, and orange juice Lose weight if you are overweight or obese Try elevating the head of your bed by placing a small wedge between your mattress and box springs to keep acid in the stomach at night instead of coming up into your esophagus Try the DASH guidelines Start the new medicine at bedtime for your restless legs; if you need to increase to two pills after one week, okay to go up to two pills before bed Return for NON-fasting labs early one morning in the next week or two  Smoking Cessation, Tips for Success If you are ready to quit smoking, congratulations! You have chosen to help yourself be healthier. Cigarettes bring nicotine, tar, carbon monoxide, and other irritants into your body. Your lungs, heart, and blood vessels will be able to work better without these poisons. There are many different ways to quit smoking. Nicotine gum, nicotine patches, a nicotine inhaler, or nicotine nasal spray can help with physical craving. Hypnosis, support groups, and medicines help break the habit of smoking. WHAT THINGS CAN I DO TO MAKE QUITTING EASIER?  Here are some tips to help you quit for good:  Pick a date when you will quit smoking completely. Tell all of your friends and family about your plan to quit on that date.  Do not try to slowly cut down  on the number of cigarettes you are smoking. Pick a quit date and quit smoking completely starting on that day.  Throw away all cigarettes.   Clean and remove all ashtrays from your home, work, and car.  On a card, write down your reasons for quitting. Carry the card with you and read it when you get the urge to smoke.  Cleanse your body of nicotine. Drink enough water and fluids to keep your urine clear or pale yellow. Do this after quitting to flush the nicotine from your body.  Learn to predict your moods. Do not let a bad situation be your excuse to have a cigarette. Some situations in your life might tempt you into wanting a cigarette.  Never have "just one" cigarette. It leads to wanting another and another. Remind yourself of your decision to quit.  Change habits associated with smoking. If you smoked while driving or when feeling stressed, try other activities to replace smoking. Stand up when drinking your coffee. Brush your teeth after eating. Sit in a different chair when you read the paper. Avoid alcohol while trying to quit, and try to drink fewer caffeinated beverages. Alcohol and caffeine may urge you to smoke.  Avoid foods and drinks that can trigger a desire to smoke, such as sugary or spicy foods and alcohol.  Ask people who smoke not to smoke around you.  Have something planned to do right after eating or having a cup of coffee. For example, plan to take a walk or exercise.  Try a relaxation exercise to calm you down and decrease your stress. Remember, you may be tense and nervous  for the first 2 weeks after you quit, but this will pass.  Find new activities to keep your hands busy. Play with a pen, coin, or rubber band. Doodle or draw things on paper.  Brush your teeth right after eating. This will help cut down on the craving for the taste of tobacco after meals. You can also try mouthwash.   Use oral substitutes in place of cigarettes. Try using lemon drops,  carrots, cinnamon sticks, or chewing gum. Keep them handy so they are available when you have the urge to smoke.  When you have the urge to smoke, try deep breathing.  Designate your home as a nonsmoking area.  If you are a heavy smoker, ask your health care provider about a prescription for nicotine chewing gum. It can ease your withdrawal from nicotine.  Reward yourself. Set aside the cigarette money you save and buy yourself something nice.  Look for support from others. Join a support group or smoking cessation program. Ask someone at home or at work to help you with your plan to quit smoking.  Always ask yourself, "Do I need this cigarette or is this just a reflex?" Tell yourself, "Today, I choose not to smoke," or "I do not want to smoke." You are reminding yourself of your decision to quit.  Do not replace cigarette smoking with electronic cigarettes (commonly called e-cigarettes). The safety of e-cigarettes is unknown, and some may contain harmful chemicals.  If you relapse, do not give up! Plan ahead and think about what you will do the next time you get the urge to smoke. HOW WILL I FEEL WHEN I QUIT SMOKING? You may have symptoms of withdrawal because your body is used to nicotine (the addictive substance in cigarettes). You may crave cigarettes, be irritable, feel very hungry, cough often, get headaches, or have difficulty concentrating. The withdrawal symptoms are only temporary. They are strongest when you first quit but will go away within 10-14 days. When withdrawal symptoms occur, stay in control. Think about your reasons for quitting. Remind yourself that these are signs that your body is healing and getting used to being without cigarettes. Remember that withdrawal symptoms are easier to treat than the major diseases that smoking can cause.  Even after the withdrawal is over, expect periodic urges to smoke. However, these cravings are generally short lived and will go away  whether you smoke or not. Do not smoke! WHAT RESOURCES ARE AVAILABLE TO HELP ME QUIT SMOKING? Your health care provider can direct you to community resources or hospitals for support, which may include:  Group support.  Education.  Hypnosis.  Therapy. Document Released: 07/13/2004 Document Revised: 03/01/2014 Document Reviewed: 04/02/2013 [facility] Patient Information 2015 [place], [place]. This information is not intended to replace advice given to you by your health care provider. Make sure you discuss any questions you have with your health care provider. DASH Eating Plan DASH stands for "Dietary Approaches to Stop Hypertension." The DASH eating plan is a healthy eating plan that has been shown to reduce high blood pressure (hypertension). Additional health benefits may include reducing the risk of type 2 diabetes mellitus, heart disease, and stroke. The DASH eating plan may also help with weight loss. WHAT DO I NEED TO KNOW ABOUT THE DASH EATING PLAN? For the DASH eating plan, you will follow these general guidelines:  Choose foods with a percent daily value for sodium of less than 5% (as listed on the food label).  Use salt-free seasonings or  herbs instead of table salt or sea salt.  Check with your health care provider or pharmacist before using salt substitutes.  Eat lower-sodium products, often labeled as "lower sodium" or "no salt added."  Eat fresh foods.  Eat more vegetables, fruits, and low-fat dairy products.  Choose whole grains. Look for the word "whole" as the first word in the ingredient list.  Choose fish and skinless chicken or [place] more often than red meat. Limit fish, poultry, and meat to 6 oz (170 g) each day.  Limit sweets, desserts, sugars, and sugary drinks.  Choose heart-healthy fats.  Limit cheese to 1 oz (28 g) per day.  Eat more home-cooked food and less restaurant, buffet, and fast food.  Limit fried foods.  Cook foods using methods other than  frying.  Limit canned vegetables. If you do use them, rinse them well to decrease the sodium.  When eating at a restaurant, ask that your food be prepared with less salt, or no salt if possible. WHAT FOODS CAN I EAT? Seek help from a dietitian for individual calorie needs. Grains Whole grain or whole wheat bread. Brown rice. Whole grain or whole wheat pasta. Quinoa, bulgur, and whole grain cereals. Low-sodium cereals. Corn or whole wheat flour tortillas. Whole grain cornbread. Whole grain crackers. Low-sodium crackers. Vegetables Fresh or frozen vegetables (raw, steamed, roasted, or grilled). Low-sodium or reduced-sodium tomato and vegetable juices. Low-sodium or reduced-sodium tomato sauce and paste. Low-sodium or reduced-sodium canned vegetables.  Fruits All fresh, canned (in natural juice), or frozen fruits. Meat and Other Protein Products Ground beef (85% or leaner), grass-fed beef, or beef trimmed of fat. Skinless chicken or [place]. Ground chicken or [place]. Pork trimmed of fat. All fish and seafood. Eggs. Dried beans, peas, or lentils. Unsalted nuts and seeds. Unsalted canned beans. Dairy Low-fat dairy products, such as skim or 1% milk, 2% or reduced-fat cheeses, low-fat ricotta or cottage cheese, or plain low-fat yogurt. Low-sodium or reduced-sodium cheeses. Fats and Oils Tub margarines without trans fats. Light or reduced-fat mayonnaise and salad dressings (reduced sodium). Avocado. Safflower, olive, or canola oils. Natural peanut or almond butter. Other Unsalted popcorn and pretzels. The items listed above may not be a complete list of recommended foods or beverages. Contact your dietitian for more options. WHAT FOODS ARE NOT RECOMMENDED? Grains White bread. White pasta. White rice. Refined cornbread. Bagels and croissants. Crackers that contain trans fat. Vegetables Creamed or fried vegetables. Vegetables in a cheese sauce. Regular canned vegetables. Regular canned tomato sauce  and paste. Regular tomato and vegetable juices. Fruits Dried fruits. Canned fruit in light or heavy syrup. Fruit juice. Meat and Other Protein Products Fatty cuts of meat. Ribs, chicken wings, bacon, sausage, bologna, salami, chitterlings, fatback, hot dogs, bratwurst, and packaged luncheon meats. Salted nuts and seeds. Canned beans with salt. Dairy Whole or 2% milk, cream, half-and-half, and cream cheese. Whole-fat or sweetened yogurt. Full-fat cheeses or blue cheese. Nondairy creamers and whipped toppings. Processed cheese, cheese spreads, or cheese curds. Condiments Onion and garlic salt, seasoned salt, table salt, and sea salt. Canned and packaged gravies. Worcestershire sauce. Tartar sauce. Barbecue sauce. Teriyaki sauce. Soy sauce, including reduced sodium. Steak sauce. Fish sauce. Oyster sauce. Cocktail sauce. Horseradish. Ketchup and mustard. Meat flavorings and tenderizers. Bouillon cubes. Hot sauce. Tabasco sauce. Marinades. Taco seasonings. Relishes. Fats and Oils Butter, stick margarine, lard, shortening, ghee, and bacon fat. Coconut, palm kernel, or palm oils. Regular salad dressings. Other Pickles and olives. Salted popcorn and pretzels. The items listed  above may not be a complete list of foods and beverages to avoid. Contact your dietitian for more information. WHERE CAN I FIND MORE INFORMATION? National Heart, [employer], [employer] Institute: travelstabloid.com Document Released: 10/04/2011 Document Revised: 03/01/2014 Document Reviewed: 08/19/2013 [facility] Patient Information 2015 [place], [place]. This information is not intended to replace advice given to you by your health care provider. Make sure you discuss any questions you have with your health care provider.

## 2015-05-27 NOTE — Assessment & Plan Note (Signed)
Monitor at home; well-controlled; bring cuff in next time to verify against ours; DASH guidelines

## 2015-05-27 NOTE — Assessment & Plan Note (Addendum)
Explained "prediabetes"; check every 6-12 months, watch diet and weight and stay active

## 2015-05-27 NOTE — Progress Notes (Signed)
BP 149/80 mmHg  Pulse 68  Temp(Src) 97.3 F (36.3 C)  Wt 181 lb (82.101 kg)  SpO2 98%   Subjective:    Patient ID: Philip Lynch, male    DOB: 02/03/1956, 59 y.o.   MRN: [id number]  HPI: Philip Lynch is a 59 y.o. male  Chief Complaint  Patient presents with  . Hypertension  . Anxiety    He stopped taking Setraline  . OTHER    Discuss lab/Test results   Since last visit, doing better; not having shakes as much as before First week back at work; had been out for 6 months for hernia, then on and off with back  Issues; works part-time A little tired today Worked outside today (very hot today)  BP recheck better; he checks it and got 125/84 and then 127/78 today Digital BP cuff that goes around the biceps White coat hypertension Tries to limit salt in his diet, just whatever is in the food already No salt even on tomato sandwiches; no decongestants usually;   He stopped the sertraline; had prescription from his last doctor and he was going to take it; he is a worrier so he thinks he'll try taking 25 mg daily instead of the 50 mg daily  Heartburn, reflux; completely controlled by the PPI; very satisfied with treatment; before, stomach would just "kill me" after a hamburger; he had EGD and colonoscopy; doing great every since, "unreal"  Last time he had the restless leg syndrome, his ferritin level was normal  Relevant past medical, surgical, family and social history reviewed and updated as indicated. Interim medical history since our last visit reviewed. Allergies and medications reviewed and updated.  Review of Systems  Per HPI unless specifically indicated above     Objective:    BP 149/80 mmHg  Pulse 68  Temp(Src) 97.3 F (36.3 C)  Wt 181 lb (82.101 kg)  SpO2 98%  Wt Readings from Last 3 Encounters:  05/27/15 181 lb (82.101 kg)  04/28/15 182 lb (82.555 kg)  04/26/15 183 lb (83.008 kg)    Physical Exam  Constitutional: He appears well-developed and  well-nourished. No distress.  HENT:  Head: Normocephalic and atraumatic.  Eyes: EOM are normal. No scleral icterus.  Neck: No thyromegaly present.  Cardiovascular: Normal rate and regular rhythm.   Pulmonary/Chest: Effort normal and breath sounds normal.  Abdominal: Soft. Bowel sounds are normal. He exhibits no distension.  Musculoskeletal: He exhibits no edema.  Neurological: Coordination normal.  Skin: Skin is warm and dry. No pallor.  Psychiatric: He has a normal mood and affect. His behavior is normal. Judgment and thought content normal.    Results for orders placed or performed in visit on 04/28/15  Ferritin  Result Value Ref Range   Ferritin 46 30 - 400 ng/mL  Hgb A1c w/o eAG  Result Value Ref Range   Hgb A1c MFr Bld 5.9 (H) 4.8 - 5.6 %  TSH  Result Value Ref Range   TSH 1.090 0.450 - 4.500 uIU/mL  Hepatic function panel  Result Value Ref Range   Total Protein 6.8 6.0 - 8.5 g/dL   Albumin 4.3 3.5 - 5.5 g/dL   Bilirubin Total 0.3 0.0 - 1.2 mg/dL   Bilirubin, Direct 0.10 0.00 - 0.40 mg/dL   Alkaline Phosphatase 142 (H) 39 - 117 IU/L   AST 19 0 - 40 IU/L   ALT 19 0 - 44 IU/L  Vit D  25 hydroxy (rtn osteoporosis monitoring)  Result  Value Ref Range   Vit D, 25-Hydroxy 27.6 (L) 30.0 - 100.0 ng/mL  Specimen status report  Result Value Ref Range   specimen status report Comment   Alkaline phosphatase, isoenzymes  Result Value Ref Range   LIVER FRACTION 56 13 - 88 %   BONE FRACTION 36 12 - 68 %   INTESTINAL FRAC. 8 0 - 18 %      Assessment & Plan:   Problem List Items Addressed This Visit      Cardiovascular and Mediastinum   Essential hypertension, benign    Monitor at home; well-controlled; bring cuff in next time to verify against ours; DASH guidelines        Other   Restless legs syndrome - Primary (Chronic)    Start new medicine      Relevant Orders   Alkaline phosphatase   Alkaline phosphatase, isoenzymes   Alkaline phosphatase elevation     Reviewed previous total plus isoenzymes with him; he was due for recheck nonfasting alk phos in early July; he'll return for that next week      Elevated hemoglobin A1c    Explained "prediabetes"; check every 6-12 months, watch diet and weight and stay active      Tobacco abuse    Encouraged cessation       Other Visit Diagnoses    Family history of hypogonadism        pt requests testosterone to be checked; return for EARLY AM labs    Relevant Orders    Testosterone, free, total    Other fatigue        pt requests testosterone to be checked; return for EARLY AM labs    Relevant Orders    Testosterone, free, total        Follow up plan: Return in about 3 months (around 08/27/2015).  Orders Placed This Encounter  Procedures  . Testosterone, free, total  . Alkaline phosphatase  . Alkaline phosphatase, isoenzymes   Meds ordered this encounter  Medications  . DISCONTD: ferrous sulfate 325 (65 FE) MG tablet    Sig: Take 325 mg by mouth daily.    Refill:  10  . sertraline (ZOLOFT) 25 MG tablet    Sig: Take 1 tablet (25 mg total) by mouth daily.    Dispense:  30 tablet    Refill:  5  . rOPINIRole (REQUIP) 0.25 MG tablet    Sig: One by mouth at bedtime; after one week, may increase to two pills at bedtime if needed    Dispense:  53 tablet    Refill:  0        

## 2015-05-27 NOTE — Assessment & Plan Note (Signed)
Start new medicine

## 2015-05-30 ENCOUNTER — Other Ambulatory Visit: Payer: BC Managed Care – PPO

## 2015-05-30 DIAGNOSIS — R748 Abnormal levels of other serum enzymes: Secondary | ICD-10-CM

## 2015-06-01 ENCOUNTER — Telehealth: Payer: Self-pay

## 2015-06-01 LAB — ALKALINE PHOSPHATASE, ISOENZYMES
BONE FRACTION: 39 % (ref 12–68)
INTESTINAL FRAC.: 6 % (ref 0–18)
LIVER FRACTION: 55 % (ref 13–88)

## 2015-06-01 NOTE — Telephone Encounter (Signed)
Patient notified

## 2015-06-01 NOTE — Telephone Encounter (Signed)
Patient is scheduled for a Bone Density on 06/09/15 @ 9am.

## 2015-06-06 ENCOUNTER — Telehealth: Payer: Self-pay | Admitting: Family Medicine

## 2015-06-06 DIAGNOSIS — R748 Abnormal levels of other serum enzymes: Secondary | ICD-10-CM

## 2015-06-06 NOTE — Telephone Encounter (Signed)
Greetings.  I have the patient's alkaline phos isoenzymes, but I'm missing the others (total alk phos and testosterone labs). Can you track those down? Thank you.

## 2015-06-07 NOTE — Assessment & Plan Note (Signed)
Persistently elevated; isoenzymes not pointing to etiology; refer to endo

## 2015-06-07 NOTE — Telephone Encounter (Signed)
I contact the lab They have a total alkaline phosphatase which they printed for me, but no testosterone result (it is showing up as overdue in Epic) Please let the patient know that the testosterone was not drawn; I do not know why; the orders (alk phos total, alk phos isoenzymes, and testosterone) were all placed at the same time during his visit July 29th I would like to suggest we have him see an endocrinologist about the elevated alk phos to find out why that is high I have a feeling they are going to draw more labs anyway, including a testosterone, so we won't ask him to come in again to have more blood drawn

## 2015-06-09 ENCOUNTER — Ambulatory Visit: Payer: BC Managed Care – PPO

## 2015-06-09 NOTE — Telephone Encounter (Signed)
Left message to call.

## 2015-06-09 NOTE — Telephone Encounter (Signed)
Patient notified

## 2015-06-21 ENCOUNTER — Ambulatory Visit
Admission: RE | Admit: 2015-06-21 | Discharge: 2015-06-21 | Disposition: A | Discharge status: Home / Self Care | Payer: BC Managed Care – PPO | Source: Ambulatory Visit | Attending: Family Medicine | Admitting: Family Medicine

## 2015-06-21 DIAGNOSIS — M8580 Other specified disorders of bone density and structure, unspecified site: Secondary | ICD-10-CM | POA: Diagnosis not present

## 2015-06-21 DIAGNOSIS — M4856XA Collapsed vertebra, not elsewhere classified, lumbar region, initial encounter for fracture: Secondary | ICD-10-CM | POA: Diagnosis present

## 2015-06-21 DIAGNOSIS — S32000S Wedge compression fracture of unspecified lumbar vertebra, sequela: Secondary | ICD-10-CM

## 2015-06-22 ENCOUNTER — Telehealth: Payer: Self-pay

## 2015-06-22 NOTE — Telephone Encounter (Signed)
Dr. Gabriel Carina got the letter you sent her. They have been trying to reach Mr. Topham. She said he'd hang up every time they called. They sent him a letter on Aug. 15th and still have not gotten a response from him. They just wanted to let you know.

## 2015-06-22 NOTE — Telephone Encounter (Signed)
Please call pt and resolve issue; thanks

## 2015-06-23 NOTE — Telephone Encounter (Signed)
Patient states he has been having trouble with with phone, he will give them a call.

## 2015-06-23 NOTE — Telephone Encounter (Signed)
Left message to call.

## 2015-06-27 ENCOUNTER — Encounter: Payer: Self-pay | Admitting: Family Medicine

## 2015-06-27 DIAGNOSIS — M8580 Other specified disorders of bone density and structure, unspecified site: Secondary | ICD-10-CM | POA: Insufficient documentation

## 2015-07-22 ENCOUNTER — Encounter: Payer: Self-pay | Admitting: Family Medicine

## 2015-07-22 ENCOUNTER — Ambulatory Visit (INDEPENDENT_AMBULATORY_CARE_PROVIDER_SITE_OTHER): Payer: BC Managed Care – PPO | Admitting: Family Medicine

## 2015-07-22 VITALS — BP 167/86 | HR 76 | Temp 97.6°F | Ht 71.5 in | Wt 178.0 lb

## 2015-07-22 DIAGNOSIS — M545 Low back pain, unspecified: Secondary | ICD-10-CM

## 2015-07-22 DIAGNOSIS — K219 Gastro-esophageal reflux disease without esophagitis: Secondary | ICD-10-CM | POA: Diagnosis not present

## 2015-07-22 DIAGNOSIS — Z720 Tobacco use: Secondary | ICD-10-CM | POA: Diagnosis not present

## 2015-07-22 DIAGNOSIS — R143 Flatulence: Secondary | ICD-10-CM

## 2015-07-22 DIAGNOSIS — Z86010 Personal history of colonic polyps: Secondary | ICD-10-CM

## 2015-07-22 DIAGNOSIS — I10 Essential (primary) hypertension: Secondary | ICD-10-CM

## 2015-07-22 DIAGNOSIS — R748 Abnormal levels of other serum enzymes: Secondary | ICD-10-CM

## 2015-07-22 DIAGNOSIS — M8580 Other specified disorders of bone density and structure, unspecified site: Secondary | ICD-10-CM

## 2015-07-22 DIAGNOSIS — R1030 Lower abdominal pain, unspecified: Secondary | ICD-10-CM

## 2015-07-22 LAB — UA/M W/RFLX CULTURE, ROUTINE
BILIRUBIN UA: NEGATIVE
Glucose, UA: NEGATIVE
Ketones, UA: NEGATIVE
Leukocytes, UA: NEGATIVE
NITRITE UA: NEGATIVE
Protein, UA: NEGATIVE
RBC, UA: NEGATIVE
Specific Gravity, UA: 1.01 (ref 1.005–1.030)
Urobilinogen, Ur: 0.2 mg/dL (ref 0.2–1.0)
pH, UA: 7 (ref 5.0–7.5)

## 2015-07-22 NOTE — Progress Notes (Signed)
BP 167/86 mmHg  Pulse 76  Temp(Src) 97.6 F (36.4 C)  Ht 5' 11.5" (1.816 m)  Wt 178 lb (80.74 kg)  BMI 24.48 kg/m2  SpO2 97%   Subjective:    Patient ID: Philip Lynch, male    DOB: 1956/02/13, 59 y.o.   MRN: [id number]  HPI: Philip Lynch is a 59 y.o. male  Chief Complaint  Patient presents with  . Back Pain    lower back and some groin pain. Wonders if it is a kidney infection.   He has had low back in the right side for about 3 weeks Seen by chiropractor a few times and he said to get checked out to make sure he doesn' thave a kidney infection He takes a walk after he eats every day; just lots of gas for hours BMs regular; no change in the caliber; no blood and no mucous He has chronic back pain, has had that for years; goes once a month to the chiropractor; he does gets adjustments The pain is more in the front; not really pain, more of a discomfort at times No fevers; feels good overall No nausea; appetite is good Pain going down into the right groin, not into the testicles; some discomfort in the upper right thigh, groin after sitting for a while, not all the time; takes it a second to get moving and then okay; no swelling of lymph nodes; same side as the hernia surgery, right at the same place; had some issues after surgery a few months after the surgery; worse then No blood in the urine; no pain with lovemaking or ejaculation; no hematospermia Last colonoscopy 43-74 years old Urine today was unremarkable  GERD; gets heartburn if he misses a few days; taking every day now  BP was 137/86 when he left the house; it is a digital BP cuff, goes up the arm; he thinks it goes up at the doctor; wife does the same thing  Osteopenia; noted on DEXA  Family hx: negative for kidney, lymphoma, colon, prostate  Relevant past medical, surgical, family and social history reviewed and updated as indicated. Interim medical history since our last visit reviewed. Allergies and medications  reviewed and updated.  Review of Systems Per HPI unless specifically indicated above     Objective:    BP 167/86 mmHg  Pulse 76  Temp(Src) 97.6 F (36.4 C)  Ht 5' 11.5" (1.816 m)  Wt 178 lb (80.74 kg)  BMI 24.48 kg/m2  SpO2 97%  Wt Readings from Last 3 Encounters:  07/22/15 178 lb (80.74 kg)  05/27/15 181 lb (82.101 kg)  04/28/15 182 lb (82.555 kg)    Physical Exam  Constitutional: He appears well-developed and well-nourished. No distress.  HENT:  Head: Normocephalic and atraumatic.  Eyes: EOM are normal. No scleral icterus.  Neck: No thyromegaly present.  Cardiovascular: Normal rate and regular rhythm.   Pulmonary/Chest: Effort normal and breath sounds normal.  Abdominal: Soft. Bowel sounds are normal. He exhibits no distension and no pulsatile midline mass. There is no hepatomegaly. There is tenderness in the right lower quadrant. There is no rigidity, no guarding and no tenderness at McBurney's point.  Surgical scars consistent with previous hernia repair  Musculoskeletal: He exhibits no edema.       Lumbar back: He exhibits normal range of motion, no tenderness, no bony tenderness, no deformity and no spasm.  Neurological: He is alert. He displays no tremor. Coordination and gait normal.  Reflex Scores:  Patellar reflexes are 2+ on the right side and 2+ on the left side. LE strength 5/5  Skin: Skin is warm and dry. No pallor.  Psychiatric: He has a normal mood and affect. His behavior is normal. Judgment and thought content normal.   Results for orders placed or performed in visit on 07/22/15  UA/M w/rflx Culture, Routine  Result Value Ref Range   Specific Gravity, UA 1.010 1.005 - 1.030   pH, UA 7.0 5.0 - 7.5   Color, UA Yellow Yellow   Appearance Ur Clear Clear   Leukocytes, UA Negative Negative   Protein, UA Negative Negative/Trace   Glucose, UA Negative Negative   Ketones, UA Negative Negative   RBC, UA Negative Negative   Bilirubin, UA Negative  Negative   Urobilinogen, Ur 0.2 0.2 - 1.0 mg/dL   Nitrite, UA Negative Negative      Assessment & Plan:   Problem List Items Addressed This Visit      Cardiovascular and Mediastinum   Essential hypertension, benign    Patient maintains that his pressures are okay at home and high at the doctor's office; patient to monitor away from the doctor and notify me if not controlled (less than 140/90)        Digestive   GERD without esophagitis   Relevant Orders   Ambulatory referral to Gastroenterology     Musculoskeletal and Integument   Osteopenia    Next bone density August 2018; explained that smoking can accelerate bone loss; calcium intake through diet important, three servings a day        Other   Alkaline phosphatase elevation    Patient has had labs done; he is going to see endo about this; will want the GI doctor to be aware as well about this elevated lab      Tobacco abuse    Patient is not ready to quit smoking      Hx of colonic polyps    Refer back to gastroenterologist; patient believes he had three polyps removed, and the date was Sept 2011 so we'll get him back in for surveillance; the doctor who performed his last colonoscopy no longer practices here      Relevant Orders   Ambulatory referral to Gastroenterology   Groin pain - Primary    Suspicious of possible adhesions, scar tissue formation at site of previous herniorrhaphy with mesh placement; discussed risk of adhesions resulting in PSBO, reasons to go to the hospital; if not improving, see surgeon      Relevant Orders   UA/M w/rflx Culture, Routine (Completed)   Ambulatory referral to Gastroenterology   Excessive gas    Patient believes excessive gas in part the cause of his symptoms; will have him try to eliminate or reduce intake of beans, bread, broccoli, etc. And use Gas-x, simethicone if needed      Low back pain    Nothing on exam to suggest the need for xrays; urine done today, negative for  blood; believe this is related to RLQ issues, hernia repair, mesh, gas other GI or ventral musculoskeletal etiology, not lumbar spine pathology; reasons to call reviewed         Follow up plan: No Follow-up on file.  An after-visit summary was printed and given to the patient at [facility].  Please see the patient instructions which may contain other information and recommendations beyond what is mentioned above in the assessment and plan.  Orders Placed This Encounter  Procedures  .  UA/M w/rflx Culture, Routine  . Ambulatory referral to Gastroenterology   Face-to-face time with patient was more than 25 minutes, >50% time spent counseling and coordination of care

## 2015-07-22 NOTE — Patient Instructions (Addendum)
Watch your symptoms and notify me of any changes or if not resolving We'll refer you to Dr. Allen Norris for a colonoscopy Try Gas-x with foods that might instigate gas (beans, broccoli, bagels, bread) Watch your stool and urine and notify me right away of any blood or change in stool caliber Modified activity (rest) over the weekend Try Advil for inflammation and discomfort (12 pills of the 200 mg strength is the MAX you can take per 24 hours) Do try to quit smoking Monitor your blood pressure periodically and notify me if over 140 on top or over 90 on the bottom   Smoking Cessation, Tips for Success If you are ready to quit smoking, congratulations! You have chosen to help yourself be healthier. Cigarettes bring nicotine, tar, carbon monoxide, and other irritants into your body. Your lungs, heart, and blood vessels will be able to work better without these poisons. There are many different ways to quit smoking. Nicotine gum, nicotine patches, a nicotine inhaler, or nicotine nasal spray can help with physical craving. Hypnosis, support groups, and medicines help break the habit of smoking. WHAT THINGS CAN I DO TO MAKE QUITTING EASIER?  Here are some tips to help you quit for good:  Pick a date when you will quit smoking completely. Tell all of your friends and family about your plan to quit on that date.  Do not try to slowly cut down on the number of cigarettes you are smoking. Pick a quit date and quit smoking completely starting on that day.  Throw away all cigarettes.   Clean and remove all ashtrays from your home, work, and car.  On a card, write down your reasons for quitting. Carry the card with you and read it when you get the urge to smoke.  Cleanse your body of nicotine. Drink enough water and fluids to keep your urine clear or pale yellow. Do this after quitting to flush the nicotine from your body.  Learn to predict your moods. Do not let a bad situation be your excuse to have a  cigarette. Some situations in your life might tempt you into wanting a cigarette.  Never have "just one" cigarette. It leads to wanting another and another. Remind yourself of your decision to quit.  Change habits associated with smoking. If you smoked while driving or when feeling stressed, try other activities to replace smoking. Stand up when drinking your coffee. Brush your teeth after eating. Sit in a different chair when you read the paper. Avoid alcohol while trying to quit, and try to drink fewer caffeinated beverages. Alcohol and caffeine may urge you to smoke.  Avoid foods and drinks that can trigger a desire to smoke, such as sugary or spicy foods and alcohol.  Ask people who smoke not to smoke around you.  Have something planned to do right after eating or having a cup of coffee. For example, plan to take a walk or exercise.  Try a relaxation exercise to calm you down and decrease your stress. Remember, you may be tense and nervous for the first 2 weeks after you quit, but this will pass.  Find new activities to keep your hands busy. Play with a pen, coin, or rubber band. Doodle or draw things on paper.  Brush your teeth right after eating. This will help cut down on the craving for the taste of tobacco after meals. You can also try mouthwash.   Use oral substitutes in place of cigarettes. Try using lemon drops, carrots,  cinnamon sticks, or chewing gum. Keep them handy so they are available when you have the urge to smoke.  When you have the urge to smoke, try deep breathing.  Designate your home as a nonsmoking area.  If you are a heavy smoker, ask your health care provider about a prescription for nicotine chewing gum. It can ease your withdrawal from nicotine.  Reward yourself. Set aside the cigarette money you save and buy yourself something nice.  Look for support from others. Join a support group or smoking cessation program. Ask someone at home or at work to help you  with your plan to quit smoking.  Always ask yourself, "Do I need this cigarette or is this just a reflex?" Tell yourself, "Today, I choose not to smoke," or "I do not want to smoke." You are reminding yourself of your decision to quit.  Do not replace cigarette smoking with electronic cigarettes (commonly called e-cigarettes). The safety of e-cigarettes is unknown, and some may contain harmful chemicals.  If you relapse, do not give up! Plan ahead and think about what you will do the next time you get the urge to smoke. HOW WILL I FEEL WHEN I QUIT SMOKING? You may have symptoms of withdrawal because your body is used to nicotine (the addictive substance in cigarettes). You may crave cigarettes, be irritable, feel very hungry, cough often, get headaches, or have difficulty concentrating. The withdrawal symptoms are only temporary. They are strongest when you first quit but will go away within 10-14 days. When withdrawal symptoms occur, stay in control. Think about your reasons for quitting. Remind yourself that these are signs that your body is healing and getting used to being without cigarettes. Remember that withdrawal symptoms are easier to treat than the major diseases that smoking can cause.  Even after the withdrawal is over, expect periodic urges to smoke. However, these cravings are generally short lived and will go away whether you smoke or not. Do not smoke! WHAT RESOURCES ARE AVAILABLE TO HELP ME QUIT SMOKING? Your health care provider can direct you to community resources or hospitals for support, which may include:  Group support.  Education.  Hypnosis.  Therapy. Document Released: 07/13/2004 Document Revised: 03/01/2014 Document Reviewed: 04/02/2013 [facility] Patient Information 2015 [place], [place]. This information is not intended to replace advice given to you by your health care provider. Make sure you discuss any questions you have with your health care provider.

## 2015-07-24 DIAGNOSIS — R143 Flatulence: Secondary | ICD-10-CM | POA: Insufficient documentation

## 2015-07-24 DIAGNOSIS — M545 Low back pain, unspecified: Secondary | ICD-10-CM | POA: Insufficient documentation

## 2015-07-24 NOTE — Assessment & Plan Note (Signed)
Suspicious of possible adhesions, scar tissue formation at site of previous herniorrhaphy with mesh placement; discussed risk of adhesions resulting in PSBO, reasons to go to the hospital; if not improving, see surgeon

## 2015-07-24 NOTE — Assessment & Plan Note (Signed)
Nothing on exam to suggest the need for xrays; urine done today, negative for blood; believe this is related to RLQ issues, hernia repair, mesh, gas other GI or ventral musculoskeletal etiology, not lumbar spine pathology; reasons to call reviewed

## 2015-07-24 NOTE — Assessment & Plan Note (Signed)
Refer back to gastroenterologist; patient believes he had three polyps removed, and the date was Sept 2011 so we'll get him back in for surveillance; the doctor who performed his last colonoscopy no longer practices here

## 2015-07-24 NOTE — Assessment & Plan Note (Signed)
Next bone density August 2018; explained that smoking can accelerate bone loss; calcium intake through diet important, three servings a day

## 2015-07-24 NOTE — Assessment & Plan Note (Signed)
Patient has had labs done; he is going to see endo about this; will want the GI doctor to be aware as well about this elevated lab

## 2015-07-24 NOTE — Assessment & Plan Note (Signed)
Patient believes excessive gas in part the cause of his symptoms; will have him try to eliminate or reduce intake of beans, bread, broccoli, etc. And use Gas-x, simethicone if needed

## 2015-07-24 NOTE — Assessment & Plan Note (Signed)
Patient maintains that his pressures are okay at home and high at the doctor's office; patient to monitor away from the doctor and notify me if not controlled (less than 140/90)

## 2015-07-24 NOTE — Assessment & Plan Note (Signed)
Patient is not ready to quit smoking 

## 2015-07-26 ENCOUNTER — Telehealth: Payer: Self-pay | Admitting: Gastroenterology

## 2015-07-26 ENCOUNTER — Encounter: Payer: Self-pay | Admitting: Gastroenterology

## 2015-07-26 NOTE — Telephone Encounter (Signed)
Patient is returning call for triage

## 2015-07-30 IMAGING — CR DG ABDOMEN 2V
1 series · 2 of 2 positions shown · non-contrast
Comparison: CT Abdomen and Pelvis 07/01/2010.

CLINICAL DATA: 58-year-old male with abdominal distention, right
lower quadrant distension. Inguinal hernia surgery repair scheduled
for this week.

EXAM:
ABDOMEN - 2 VIEW

[Series 1: dxr abdomen 2 v flat and erect · 0.14mm/px · 2 of 2 slices shown]
[im 1/2]
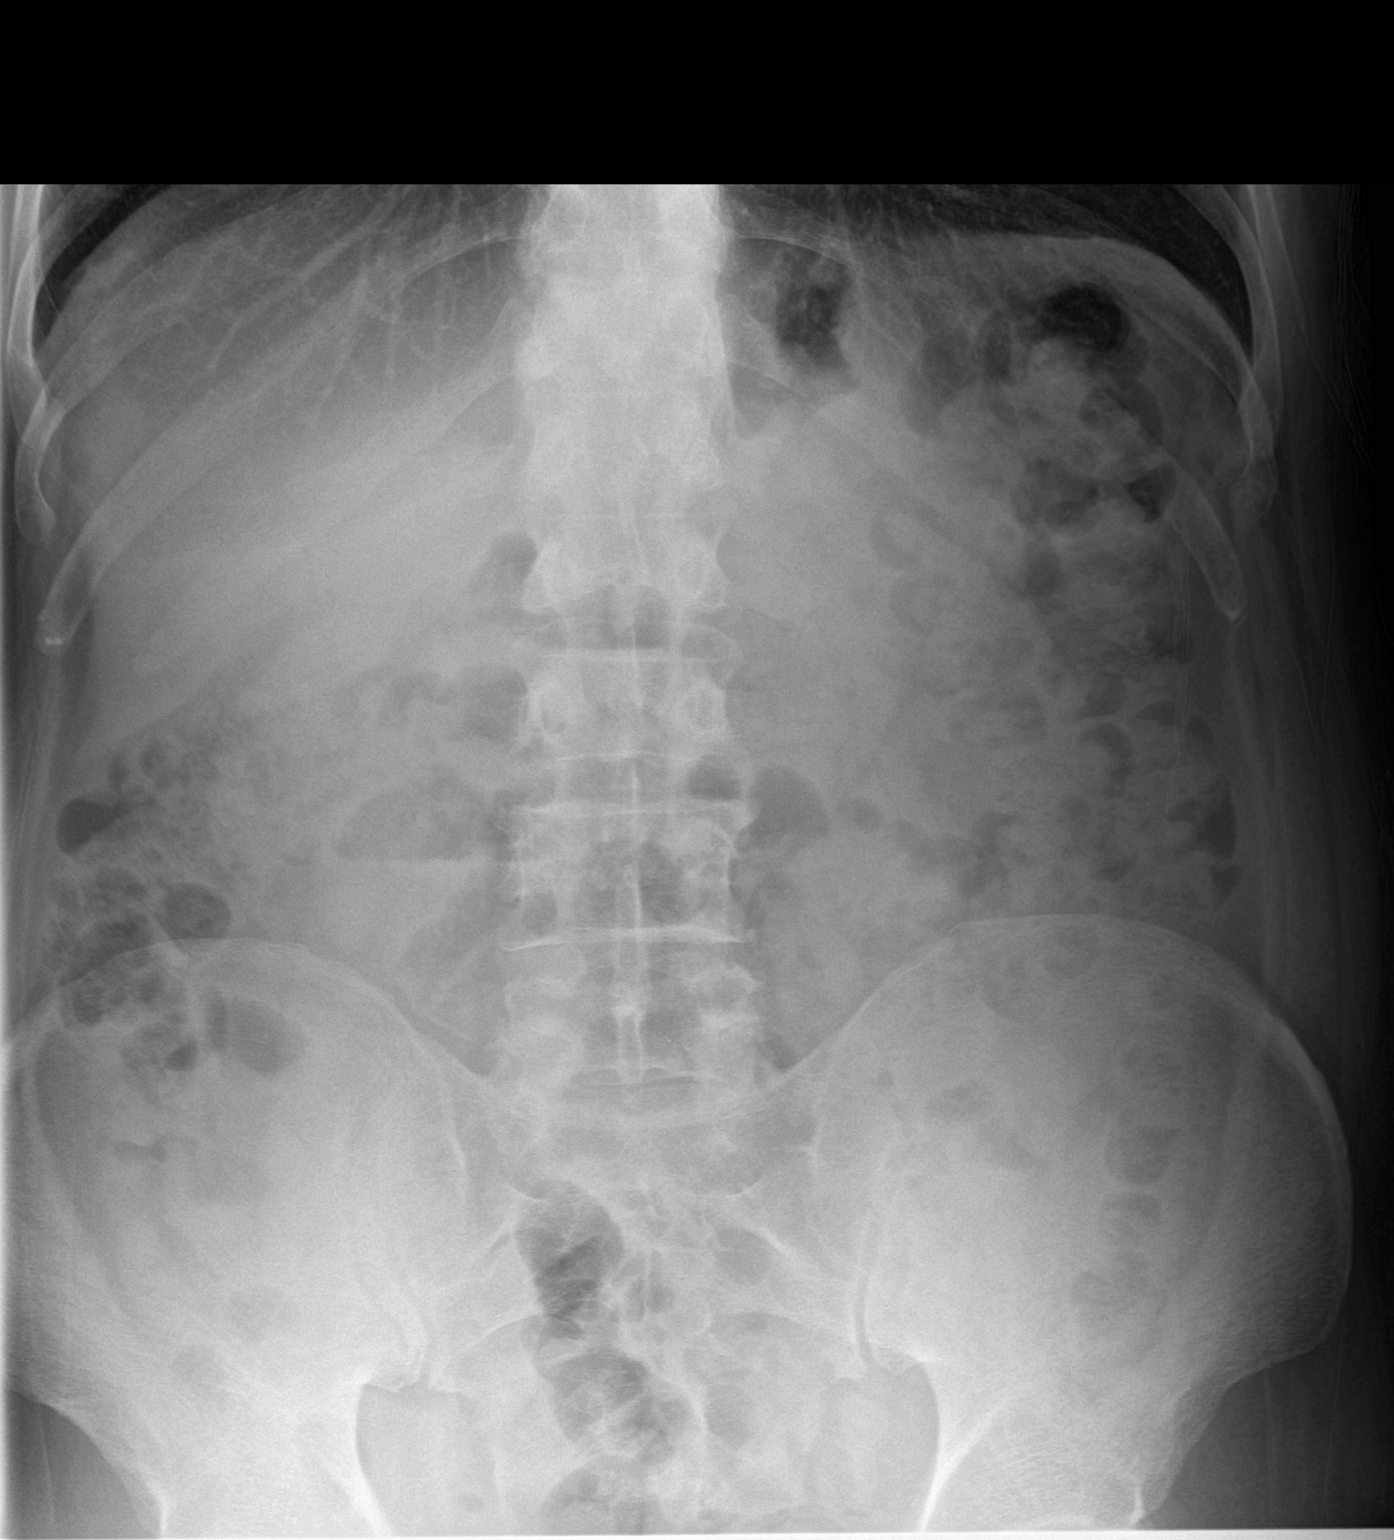
[im 2/2]
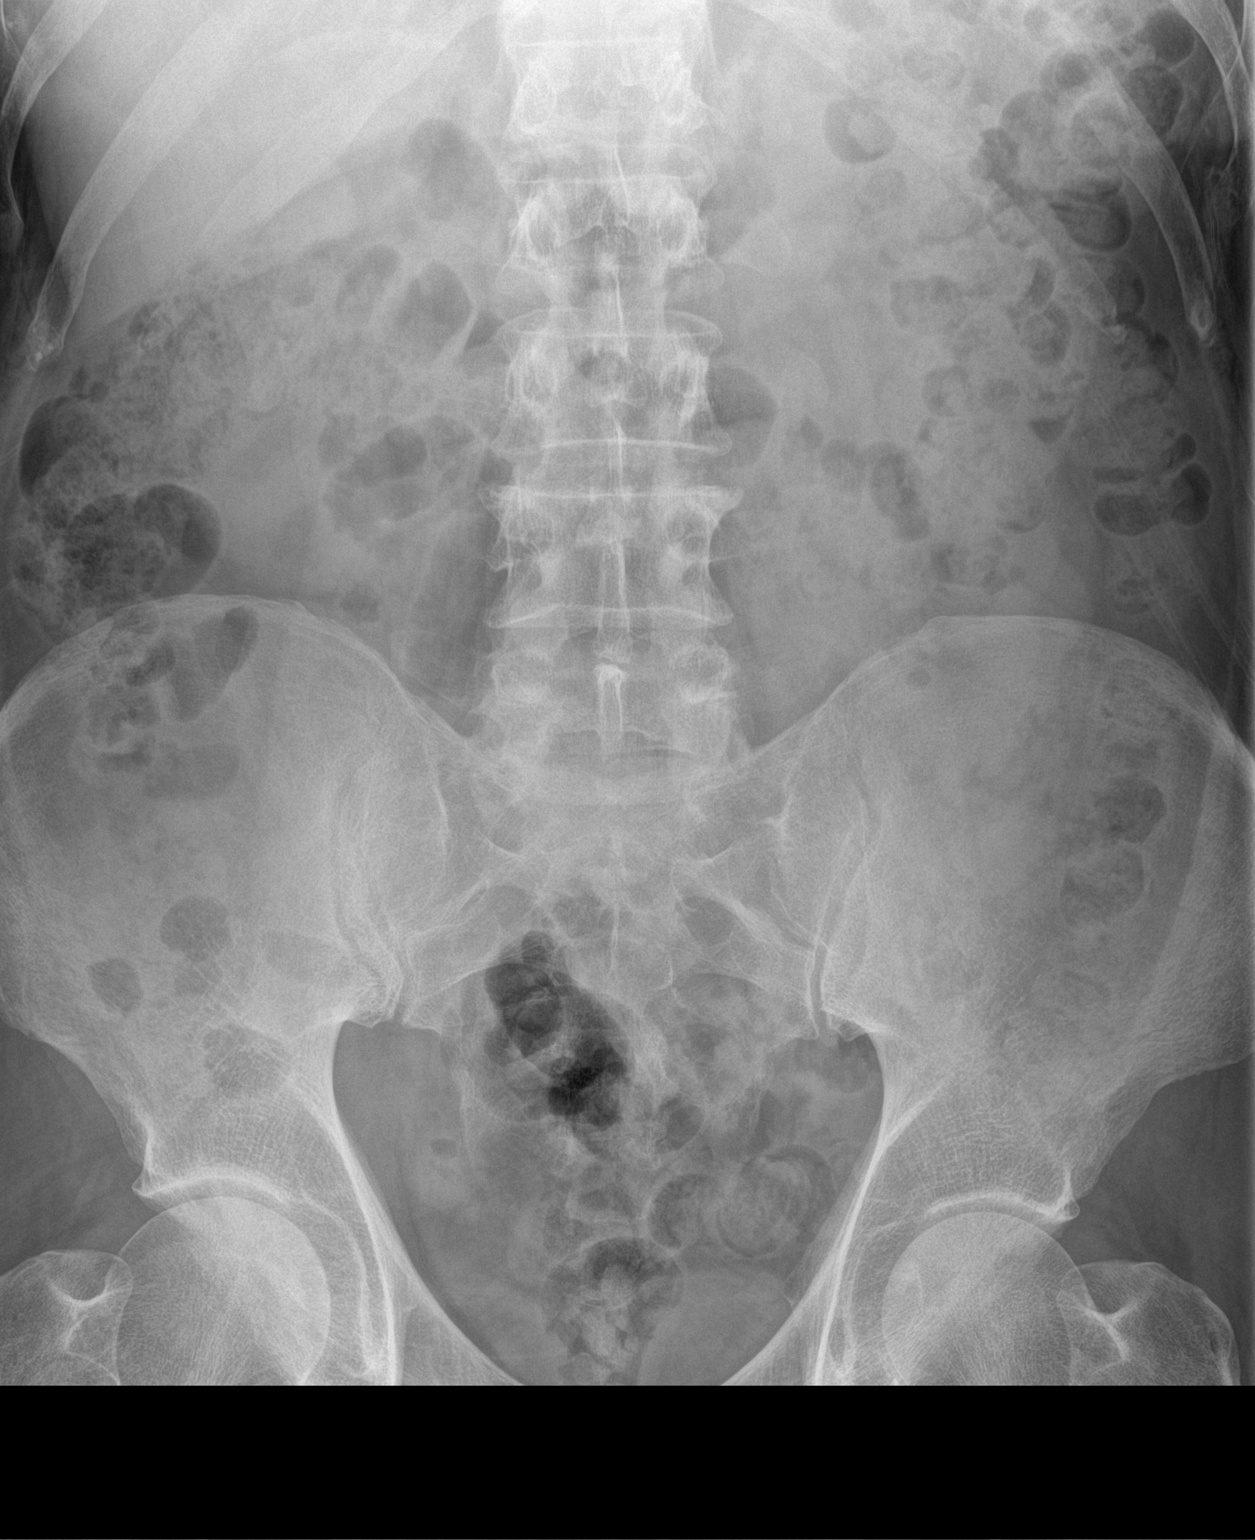

[2 of 2 positions shown; findings below may reference images not displayed]

FINDINGS: At the time of the 0744 comparison there was a small right inguinal
hernia which contained fat and the appendix.

On these upright and supine views no bowel gas is visible in the
right inguinal region. Non obstructed bowel gas pattern. Mild to
moderate volume of retained stool throughout the colon. Negative
lung bases. No pneumoperitoneum. Abdominal and pelvic visceral
contours are within normal limits. Suggestion of mild L1 and L2
compression fractures, age indeterminate. SI joints within normal
limits. No acute osseous abnormality identified.
IMPRESSION: 1. Normal bowel gas pattern, no free air. No bowel gas evident in
the right inguinal region.
2. Suggestion of mild age indeterminate upper lumbar compression
fractures.
These results will be called to the ordering clinician or
representative by the [HOSPITAL] at the imaging location.

## 2015-08-26 ENCOUNTER — Encounter: Payer: Self-pay | Admitting: Family Medicine

## 2015-08-26 ENCOUNTER — Ambulatory Visit (INDEPENDENT_AMBULATORY_CARE_PROVIDER_SITE_OTHER): Payer: BC Managed Care – PPO | Admitting: Family Medicine

## 2015-08-26 VITALS — BP 154/79 | HR 72 | Temp 97.9°F | Ht 72.0 in | Wt 175.0 lb

## 2015-08-26 DIAGNOSIS — E291 Testicular hypofunction: Secondary | ICD-10-CM | POA: Diagnosis not present

## 2015-08-26 DIAGNOSIS — Z720 Tobacco use: Secondary | ICD-10-CM | POA: Diagnosis not present

## 2015-08-26 DIAGNOSIS — R748 Abnormal levels of other serum enzymes: Secondary | ICD-10-CM | POA: Diagnosis not present

## 2015-08-26 DIAGNOSIS — I517 Cardiomegaly: Secondary | ICD-10-CM | POA: Diagnosis not present

## 2015-08-26 DIAGNOSIS — I10 Essential (primary) hypertension: Secondary | ICD-10-CM | POA: Diagnosis not present

## 2015-08-26 DIAGNOSIS — M545 Low back pain: Secondary | ICD-10-CM
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
## 2015-08-26 NOTE — Patient Instructions (Addendum)
Keep an eye on your weight and let me know if decreasing without you trying to lose Good luck with smoking cessation and let me know if I can help Monitor your blood pressure at home and let me know if top number is not under 140 Try to follow DASH guidelines I've put in a referral to the urologist about your testosterone Return in 6 months

## 2015-08-31 NOTE — Assessment & Plan Note (Signed)
Patient is interested in seeing a urologist who would entertain the use of clomid, as he has heard about from others; I do not prescribe that, so patient will be referred to urologist

## 2015-08-31 NOTE — Assessment & Plan Note (Signed)
Patient wishes to keep an eye on his BP at home and contact me if not at goal; continue ARB, continue to work on weight loss and following DASH guidelines

## 2015-08-31 NOTE — Assessment & Plan Note (Signed)
Offered further work-up; declined by patient

## 2015-08-31 NOTE — Assessment & Plan Note (Signed)
Control BP, weight; limit sodium intake

## 2015-08-31 NOTE — Assessment & Plan Note (Signed)
Smoking cessation info given in AVS; I am here to help if and when he is ready to quit

## 2015-08-31 NOTE — Assessment & Plan Note (Signed)
Patient was seen by endocrinologist for this; normal on the recheck, alk phos was 96 on October 6th

## 2015-09-05 ENCOUNTER — Encounter: Payer: Self-pay | Admitting: Urology

## 2015-09-05 ENCOUNTER — Ambulatory Visit (INDEPENDENT_AMBULATORY_CARE_PROVIDER_SITE_OTHER): Payer: BC Managed Care – PPO | Admitting: Urology

## 2015-09-05 VITALS — BP 164/81 | HR 76 | Ht 72.0 in | Wt 177.3 lb

## 2015-09-05 DIAGNOSIS — N400 Enlarged prostate without lower urinary tract symptoms: Secondary | ICD-10-CM | POA: Diagnosis not present

## 2015-09-05 DIAGNOSIS — E291 Testicular hypofunction: Secondary | ICD-10-CM | POA: Diagnosis not present

## 2015-09-05 MED ORDER — CLOMIPHENE CITRATE 50 MG PO TABS: 50.0000 mg | ORAL_TABLET | Freq: Every day | ORAL | Status: DC

## 2015-09-05 NOTE — Progress Notes (Signed)
09/05/2015 3:34 PM   Bertram Gala Dumas 31-Jul-1956 [id number]  Referring provider: Arnetha Courser, MD [address] [place], [place] [address]  Chief Complaint  Patient presents with  . Hypogonadism    referred  [facility]. [facility]    HPI: Patient is a 59 year old white male who was found to be hypogonadal by his primary care physician, Dr. Robby Sermon, who is referred to [place] for further evaluation and management.    Hypogonadism Patient is presenting with the symptoms of reduced libido and erectile dysfunction.  He is also experiencing a reduced incidence of spontaneous erections,  a decrease in physical and work performance,  decreased energy and motivation and a decrease in cognitive function.  This is indicated by his responses to the ADAM questionnaire.  His pretreatment testosterone level was 210 ng/dL on 08/04/2015 and 191.6 ng/dL 08/12/2015.  His FSH, LH and prolactin are 4.8, 2.2 and 8.9, respectively.    BPH WITH LUTS His IPSS score today is 4, which is mild lower urinary tract symptomatology. He is delighted with his quality life due to his urinary symptoms.  His major complaint today frequency and nocturia.  He has had these symptoms for few years.  He attributes this to drinking over 15 bottles of 8 ounces of water daily.  He denies any dysuria, hematuria or suprapubic pain.  He also denies any recent fevers, chills, nausea or vomiting.  He does not have a family history of PCa.      IPSS      09/05/15 1500       International Prostate Symptom Score   How often have you had the sensation of not emptying your bladder? Not at All     How often have you had to urinate less than every two hours? About half the time     How often have you found you stopped and started again several times when you urinated? Not at All     How often have you found it difficult to postpone urination? Not at All     How often have you had a weak urinary stream? Not at All     How often have you had  to strain to start urination? Not at All     How many times did you typically get up at night to urinate? 1 Time     Total IPSS Score 4     Quality of Life due to urinary symptoms   If you were to spend the rest of your life with your urinary condition just the way it is now how would you feel about that? Delighted        Score:  1-7 Mild 8-19 Moderate 20-35 Severe  PMH: Past Medical History  Diagnosis Date  . Colon polyp   . Hypertension   . Arthritis     knees  . Acid reflux   . RLS (restless legs syndrome)   . Heart burn   . Anxiety   . Depression   . History of kidney stones     Surgical History: Past Surgical History  Procedure Laterality Date  . Colonoscopy  2012    Dr. Tobias Alexander  . Upper gi endoscopy  2012  . Hernia repair Right 02/01/15    inguinal     Home Medications:    Medication List       This list is accurate as of: 09/05/15  3:34 PM.  Always use your most recent med list.  clomiPHENE 50 MG tablet  Commonly known as:  CLOMID  Take 1 tablet (50 mg total) by mouth daily. Take one half tablet daily     fluticasone 50 MCG/ACT nasal spray  Commonly known as:  FLONASE  USE 1 SPRAY IN EACH NOSTRIL EVERY DAY     ibuprofen 200 MG tablet  Commonly known as:  ADVIL,MOTRIN  Take 200 mg by mouth every 6 (six) hours as needed.     losartan 100 MG tablet  Commonly known as:  COZAAR  Take 100 mg by mouth daily.     omeprazole 20 MG capsule  Commonly known as:  PRILOSEC  Take 20 mg by mouth daily.     sertraline 25 MG tablet  Commonly known as:  ZOLOFT  Take 1 tablet (25 mg total) by mouth daily.     VITAMIN D-1000 MAX ST 1000 UNITS tablet  Generic drug:  Cholecalciferol  Take by mouth.        Allergies: No Known Allergies  Family History: Family History  Problem Relation Age of Onset  . COPD Mother   . Hypertension Mother   . Heart attack Father   . Heart disease Father   . Hypertension Father   . Diabetes Maternal  Uncle   . Cancer Maternal Grandmother     colon  . Kidney disease Neg Hx   . Prostate cancer Neg Hx     Social History:  reports that he has been smoking Cigarettes.  He has a 30 pack-year smoking history. He has never used smokeless tobacco. He reports that he does not drink alcohol or use illicit drugs.  ROS: UROLOGY Frequent Urination?: No Hard to postpone urination?: No Burning/pain with urination?: No Get up at night to urinate?: Yes Leakage of urine?: No Urine stream starts and stops?: No Trouble starting stream?: No Do you have to strain to urinate?: No Blood in urine?: No Urinary tract infection?: No Sexually transmitted disease?: No Injury to kidneys or bladder?: No Painful intercourse?: No Weak stream?: No Erection problems?: Yes Penile pain?: No  Gastrointestinal Nausea?: No Vomiting?: No Indigestion/heartburn?: No Diarrhea?: No Constipation?: No  Constitutional Fever: No Night sweats?: No Weight loss?: No Fatigue?: No  Skin Skin rash/lesions?: No Itching?: No  Eyes Blurred vision?: No Double vision?: No  Ears/Nose/Throat Sore throat?: No Sinus problems?: No  Hematologic/Lymphatic Swollen glands?: No Easy bruising?: No  Cardiovascular Leg swelling?: No Chest pain?: No  Respiratory Cough?: No Shortness of breath?: No  Endocrine Excessive thirst?: No  Musculoskeletal Back pain?: Yes Joint pain?: Yes  Neurological Headaches?: No Dizziness?: No  Psychologic Depression?: Yes Anxiety?: Yes  Physical Exam: BP 164/81 mmHg  Pulse 76  Ht 6' (1.829 m)  Wt 177 lb 4.8 oz (80.423 kg)  BMI 24.04 kg/m2  Constitutional: Well nourished. Alert and oriented, No acute distress. HEENT: [place] AT, moist mucus membranes. Trachea midline, no masses. Cardiovascular: No clubbing, cyanosis, or edema. Respiratory: Normal respiratory effort, no increased work of breathing. GI: Abdomen is soft, non tender, non distended, no abdominal masses. Liver  and spleen not palpable.  No hernias appreciated.  Stool sample for occult testing is not indicated.   GU: No CVA tenderness.  No bladder fullness or masses.  Patient with circumcised phallus.  Urethral meatus is patent.  No penile discharge. No penile lesions or rashes. Scrotum without lesions, cysts, rashes and/or edema.  Testicles are located scrotally bilaterally. No masses are appreciated in the testicles. Left and right epididymis are normal. Rectal: Patient  with  normal sphincter tone. Anus and perineum without scarring or rashes. No rectal masses are appreciated. Prostate is approximately 50 grams, no nodules are appreciated. Seminal vesicles are normal. Skin: No rashes, bruises or suspicious lesions. Lymph: No cervical or inguinal adenopathy. Neurologic: Grossly intact, no focal deficits, moving all 4 extremities. Psychiatric: Normal mood and affect.  Laboratory Data: Lab Results  Component Value Date   WBC 8.7 04/26/2015   HGB 14.9 04/26/2015   HCT 44.0 04/26/2015   MCV 90.8 04/26/2015   PLT 316 04/26/2015    Lab Results  Component Value Date   CREATININE 0.80 04/26/2015    Lab Results  Component Value Date   HGBA1C 5.9* 04/28/2015     Assessment & Plan:    1. Hypogonadism:   I discussed with the patient the side effects of testosterone therapy, such as: enlargement of the prostate gland that may in turn cause LUTS, possible increased risk of PCa, DVT's and/or PE's, possible increased risk of heart attack or stroke, lower sperm count, swelling of the ankles, feet, or body, with or without heart failure, enlarged or painful breasts, have problems breathing while you sleep (sleep apnea), increased prostate specific antigen, mood swings, hypertension and increased red blood cell count.  I also discussed that some men have had success using clomid for hypogonadism.  It does seem to be more successful in younger men, but there are incidences of good results in middle-aged men.   I explained that it is used in male infertility to stimulate the testicles to make more testosterone/sperm.  There has been no long term data on side effects, but some urologists has been having success with this medication.   He would like to try the Clomid.  I have sent a prescription for 50 mg to take one half tab daily to his pharmacy. He will return in 1 month for a serum testosterone drawn before 9 AM.  With a new concerns with the FDA for MI, stroke and abuse potential, we will see him initially every 3 months for hematocrit and testosterone levels.  I have checked a PSA and lipid panel today.  LFT's were within normal limits with the 08/04/2015 blood work.   2. BPH with obstruction/lower urinary tract symptoms:   IPSS score is 4/0.  We will continue to monitor.  He will RTC in 3 months for IPSS score.    - PSA   Return in about 1 month (around 10/05/2015) for morning testosterone before 9 AM.  Zara Council, [facility] [address] [place], [place] [address] [phone]

## 2015-09-06 LAB — LIPID PANEL
Chol/HDL Ratio: 4.2 ratio units (ref 0.0–5.0)
Cholesterol, Total: 221 mg/dL — ABNORMAL HIGH (ref 100–199)
HDL: 53 mg/dL (ref >39)
LDL Calculated: 133 mg/dL — ABNORMAL HIGH (ref 0–99)
TRIGLYCERIDES: 173 mg/dL — AB (ref 0–149)
VLDL CHOLESTEROL CAL: 35 mg/dL (ref 5–40)

## 2015-09-06 LAB — PSA: PROSTATE SPECIFIC AG, SERUM: 2.1 ng/mL (ref 0.0–4.0)

## 2015-09-07 ENCOUNTER — Encounter: Payer: Self-pay | Admitting: Gastroenterology

## 2015-09-07 ENCOUNTER — Ambulatory Visit (INDEPENDENT_AMBULATORY_CARE_PROVIDER_SITE_OTHER): Payer: BC Managed Care – PPO | Admitting: Gastroenterology

## 2015-09-07 VITALS — BP 145/85 | HR 68 | Temp 97.7°F | Ht 72.0 in | Wt 178.0 lb

## 2015-09-07 DIAGNOSIS — K219 Gastro-esophageal reflux disease without esophagitis: Secondary | ICD-10-CM | POA: Diagnosis not present

## 2015-09-07 NOTE — Progress Notes (Signed)
Gastroenterology Consultation  Referring Provider:     Arnetha Courser, MD Primary Care Physician:  Enid Derry, MD Primary Gastroenterologist:  Dr. Allen Norris     Reason for Consultation:     [name]        HPI:   Philip Lynch is a 59 y.o. y/o male referred for consultation & management of  Gerd by Dr. Enid Derry, MD.   This patient comes today with a history of heartburn. The patient states he is well-controlled on his omeprazole. There is no report of any dysphagia, nausea, vomiting, fevers or chills. The patient also reports that he has a history of colon polyps with his last colonoscopy five years ago. He was told that he would need another colonoscopy at this time. There's no report of any black stools or bloody stools. There is no report of any dysphagia or unexplained weight loss.  Past Medical History  Diagnosis Date  . Colon polyp   . Hypertension   . Arthritis     knees  . Acid reflux   . RLS (restless legs syndrome)   . Heart burn   . Anxiety   . Depression   . History of kidney stones     Past Surgical History  Procedure Laterality Date  . Colonoscopy  2012    Dr. Tobias Alexander  . Upper gi endoscopy  2012  . Hernia repair Right 02/01/15    inguinal     Prior to Admission medications   Medication Sig Start Date End Date Taking? Authorizing Provider  Cholecalciferol (VITAMIN D-1000 MAX ST) 1000 UNITS tablet Take by mouth.   Yes Historical Provider, MD  clomiPHENE (CLOMID) 50 MG tablet Take 1 tablet (50 mg total) by mouth daily. Take one half tablet daily 09/05/15  Yes Shannon A McGowan, PA-C  fluticasone (FLONASE) 50 MCG/ACT nasal spray USE 1 SPRAY IN EACH NOSTRIL EVERY DAY 03/29/15  Yes Historical Provider, MD  ibuprofen (ADVIL,MOTRIN) 200 MG tablet Take 200 mg by mouth every 6 (six) hours as needed.   Yes Historical Provider, MD  losartan (COZAAR) 100 MG tablet Take 100 mg by mouth daily. 01/18/15  Yes Historical Provider, MD  omeprazole (PRILOSEC) 20 MG capsule Take 20 mg  by mouth daily. 01/18/15  Yes Historical Provider, MD  sertraline (ZOLOFT) 25 MG tablet Take 1 tablet (25 mg total) by mouth daily. 05/27/15  Yes Arnetha Courser, MD    Family History  Problem Relation Age of Onset  . COPD Mother   . Hypertension Mother   . Heart attack Father   . Heart disease Father   . Hypertension Father   . Diabetes Maternal Uncle   . Cancer Maternal Grandmother     colon  . Kidney disease Neg Hx   . Prostate cancer Neg Hx      Social History  Substance Use Topics  . Smoking status: Current Every Day Smoker -- 1.00 packs/day for 30 years    Types: Cigarettes  . Smokeless tobacco: Never Used  . Alcohol Use: No    Allergies as of 09/07/2015  . (No Known Allergies)    Review of Systems:    All systems reviewed and negative except where noted in HPI.   Physical Exam:  BP 145/85 mmHg  Pulse 68  Temp(Src) 97.7 F (36.5 C) (Oral)  Ht 6' (1.829 m)  Wt 178 lb (80.74 kg)  BMI 24.14 kg/m2 No LMP for male patient. Psych:  Alert and cooperative. Normal mood and  affect. General:   Alert,  Well-developed, well-nourished, pleasant and cooperative in NAD Head:  Normocephalic and atraumatic. Eyes:  Sclera clear, no icterus.   Conjunctiva pink. Ears:  Normal auditory acuity. Nose:  No deformity, discharge, or lesions. Mouth:  No deformity or lesions,oropharynx pink & moist. Neck:  Supple; no masses or thyromegaly. Lungs:  Respirations even and unlabored.  Clear throughout to auscultation.   No wheezes, crackles, or rhonchi. No acute distress. Heart:  Regular rate and rhythm; no murmurs, clicks, rubs, or gallops. Abdomen:  Normal bowel sounds.  No bruits.  Soft, non-tender and non-distended without masses, hepatosplenomegaly or hernias noted.  No guarding or rebound tenderness.  Negative Carnett sign.   Rectal:  Deferred.  Msk:  Symmetrical without gross deformities.  Good, equal movement & strength bilaterally. Pulses:  Normal pulses noted. Extremities:  No  clubbing or edema.  No cyanosis. Neurologic:  Alert and oriented x3;  grossly normal neurologically. Skin:  Intact without significant lesions or rashes.  No jaundice. Lymph Nodes:  No significant cervical adenopathy. Psych:  Alert and cooperative. Normal mood and affect.  Imaging Studies: No results found.  Assessment and Plan:   [name] is a 59 y.o. y/o male Who has a history of colon polyps and will be set up for a colonoscopy. The patient also has heartburn for which he takes omeprazole. The patient has been doing well on the omeprazole and has been told to continue the medication. The patient will follow up at the time of the colonoscopy. I have discussed risks & benefits which include, but are not limited to, bleeding, infection, perforation & drug reaction.  The patient agrees with this plan & written consent will be obtained.      Note: This dictation was prepared with Dragon dictation along with smaller phrase technology. Any transcriptional errors that result from this process are unintentional.

## 2015-09-12 ENCOUNTER — Other Ambulatory Visit: Payer: Self-pay

## 2015-09-15 ENCOUNTER — Telehealth: Payer: Self-pay

## 2015-09-15 NOTE — Telephone Encounter (Signed)
PA for clomid has been approved. LMOM making pt aware.

## 2015-10-03 ENCOUNTER — Other Ambulatory Visit: Payer: Self-pay | Admitting: Family Medicine

## 2015-10-03 ENCOUNTER — Encounter: Payer: Self-pay | Admitting: *Deleted

## 2015-10-03 NOTE — Telephone Encounter (Signed)
He shouldn't be due for a refill of sertraline until the end of January

## 2015-10-03 NOTE — Telephone Encounter (Signed)
Your patient.  Thanks 

## 2015-10-05 ENCOUNTER — Other Ambulatory Visit: Payer: BC Managed Care – PPO

## 2015-10-05 NOTE — Discharge Instructions (Signed)

## 2015-10-06 ENCOUNTER — Ambulatory Visit
Admission: RE | Admit: 2015-10-06 | Discharge: 2015-10-06 | Disposition: A | Discharge status: Home / Self Care | Payer: BC Managed Care – PPO | Source: Ambulatory Visit | Attending: Gastroenterology | Admitting: Gastroenterology

## 2015-10-06 ENCOUNTER — Other Ambulatory Visit: Payer: Self-pay | Admitting: Gastroenterology

## 2015-10-06 ENCOUNTER — Ambulatory Visit: Payer: BC Managed Care – PPO | Admitting: Anesthesiology

## 2015-10-06 ENCOUNTER — Encounter
Admission: RE | Disposition: A | Discharge status: Home / Self Care | Payer: Self-pay | Source: Ambulatory Visit | Attending: Gastroenterology

## 2015-10-06 DIAGNOSIS — D125 Benign neoplasm of sigmoid colon: Secondary | ICD-10-CM

## 2015-10-06 DIAGNOSIS — Z1211 Encounter for screening for malignant neoplasm of colon: Secondary | ICD-10-CM | POA: Diagnosis not present

## 2015-10-06 DIAGNOSIS — K641 Second degree hemorrhoids: Secondary | ICD-10-CM | POA: Insufficient documentation

## 2015-10-06 DIAGNOSIS — Z860100 Personal history of colon polyps, unspecified: Secondary | ICD-10-CM | POA: Insufficient documentation

## 2015-10-06 DIAGNOSIS — F17210 Nicotine dependence, cigarettes, uncomplicated: Secondary | ICD-10-CM | POA: Insufficient documentation

## 2015-10-06 DIAGNOSIS — K635 Polyp of colon: Secondary | ICD-10-CM | POA: Diagnosis not present

## 2015-10-06 DIAGNOSIS — K219 Gastro-esophageal reflux disease without esophagitis: Secondary | ICD-10-CM | POA: Insufficient documentation

## 2015-10-06 DIAGNOSIS — I10 Essential (primary) hypertension: Secondary | ICD-10-CM | POA: Diagnosis not present

## 2015-10-06 HISTORY — PX: COLONOSCOPY WITH PROPOFOL: SHX5780

## 2015-10-06 HISTORY — DX: Dizziness and giddiness: R42

## 2015-10-06 HISTORY — PX: POLYPECTOMY: SHX5525

## 2015-10-06 SURGERY — COLONOSCOPY WITH PROPOFOL
Anesthesia: Monitor Anesthesia Care | Wound class: Contaminated

## 2015-10-06 MED ORDER — PROPOFOL 10 MG/ML IV BOLUS
INTRAVENOUS | Status: DC | PRN
  Administered 2015-10-06: 50 mg via INTRAVENOUS
  Administered 2015-10-06 (×2): 40 mg via INTRAVENOUS
  Administered 2015-10-06: 70 mg via INTRAVENOUS

## 2015-10-06 MED ORDER — LACTATED RINGERS IV SOLN
INTRAVENOUS | Status: DC
  Administered 2015-10-06: 08:00:00 via INTRAVENOUS

## 2015-10-06 MED ORDER — ACETAMINOPHEN 325 MG PO TABS: 325.0000 mg | ORAL_TABLET | ORAL | Status: DC | PRN

## 2015-10-06 MED ORDER — SODIUM CHLORIDE 0.9 % IV SOLN
INTRAVENOUS | Status: DC
Start: 2015-10-06 — End: 2015-10-06

## 2015-10-06 MED ORDER — ACETAMINOPHEN 160 MG/5ML PO SOLN: 325.0000 mg | ORAL | Status: DC | PRN

## 2015-10-06 MED ORDER — LIDOCAINE HCL (CARDIAC) 20 MG/ML IV SOLN
INTRAVENOUS | Status: DC | PRN
  Administered 2015-10-06: 20 mg via INTRAVENOUS

## 2015-10-06 MED ORDER — SIMETHICONE 40 MG/0.6ML PO SUSP
ORAL | Status: DC | PRN
  Administered 2015-10-06: 08:00:00

## 2015-10-06 SURGICAL SUPPLY — 28 items

## 2015-10-06 NOTE — Anesthesia Procedure Notes (Signed)
Procedure Name: MAC Performed by: Duana Benedict Pre-anesthesia Checklist: Patient identified, Emergency Drugs available, Suction available, Patient being monitored and Timeout performed Patient Re-evaluated:Patient Re-evaluated prior to inductionOxygen Delivery Method: Nasal cannula       

## 2015-10-06 NOTE — Op Note (Signed)
[facility] Gastroenterology Patient Name: Philip Lynch Procedure Date: 10/06/2015 8:18 AM MRN: [id number] Account #: [id number] Date of Birth: 18-Sep-1956 Admit Type: Outpatient Age: 59 Room: [facility] OR ROOM 01 Gender: Male Note Status: Finalized Procedure:         Colonoscopy Indications:       High risk colon cancer surveillance: Personal history of                     colonic polyps Providers:         Lucilla Lame, MD Referring MD:      Arnetha Courser (Referring MD) Medicines:         Propofol per Anesthesia Complications:     No immediate complications. Procedure:         Pre-Anesthesia Assessment:                    - Prior to the procedure, a History and Physical was                     performed, and patient medications and allergies were                     reviewed. The patient's tolerance of previous anesthesia                     was also reviewed. The risks and benefits of the procedure                     and the sedation options and risks were discussed with the                     patient. All questions were answered, and informed consent                     was obtained. Prior Anticoagulants: The patient has taken                     no previous anticoagulant or antiplatelet agents. ASA                     Grade Assessment: II - A patient with mild systemic                     disease. After reviewing the risks and benefits, the                     patient was deemed in satisfactory condition to undergo                     the procedure.                    After obtaining informed consent, the colonoscope was                     passed under direct vision. Throughout the procedure, the                     patient's blood pressure, pulse, and oxygen saturations                     were monitored continuously. The Marshall  colonoscope (S#: [id number]) was introduced through the anus                     and advanced to the the  cecum, identified by appendiceal                     orifice and ileocecal valve. The colonoscopy was performed                     without difficulty. The patient tolerated the procedure                     well. The quality of the bowel preparation was excellent. Findings:      The perianal and digital rectal examinations were normal.      Two sessile polyps were found in the sigmoid colon. The polyps were 3 to       4 mm in size. These polyps were removed with a cold biopsy forceps.       Resection and retrieval were complete.      Non-bleeding internal hemorrhoids were found during retroflexion. The       hemorrhoids were Grade II (internal hemorrhoids that prolapse but reduce       spontaneously). Impression:        - Two 3 to 4 mm polyps in the sigmoid colon. Resected and                     retrieved.                    - Non-bleeding internal hemorrhoids. Recommendation:    - Await pathology results.                    - Repeat colonoscopy in 5 years for surveillance. Procedure Code(s): --- Professional ---                    [phone], Colonoscopy, flexible; with biopsy, single or                     multiple Diagnosis Code(s): --- Professional ---                    Z86.010, Personal history of colonic polyps                    D12.5, Benign neoplasm of sigmoid colon CPT copyright 2014 American Medical Association. All rights reserved. The codes documented in this report are preliminary and upon coder review may  be revised to meet current compliance requirements. Lucilla Lame, MD 10/06/2015 8:38:09 AM This report has been signed electronically. Number of Addenda: 0 Note Initiated On: 10/06/2015 8:18 AM Scope Withdrawal Time: 0 hours 7 minutes 45 seconds  Total Procedure Duration: 0 hours 10 minutes 55 seconds       [facility]

## 2015-10-06 NOTE — H&P (Signed)
[facility]  [address], [place], [place] [address] Phone: [phone] Fax : [phone]  Primary Care Physician:  Enid Derry, MD Primary Gastroenterologist:  Dr. Allen Norris  Pre-Procedure History & Physical: HPI:  Philip Lynch is a 59 y.o. male is here for an colonoscopy.   Past Medical History  Diagnosis Date  . Colon polyp   . Hypertension   . Arthritis     knees  . Acid reflux   . RLS (restless legs syndrome)   . Heart burn   . Anxiety   . Depression   . History of kidney stones   . Vertigo     (x1) several months ago    Past Surgical History  Procedure Laterality Date  . Colonoscopy  2012    Dr. Tobias Alexander  . Upper gi endoscopy  2012  . Hernia repair Right 02/01/15    inguinal     Prior to Admission medications   Medication Sig Start Date End Date Taking? Authorizing Provider  clomiPHENE (CLOMID) 50 MG tablet Take 1 tablet (50 mg total) by mouth daily. Take one half tablet daily 09/05/15  Yes Shannon A McGowan, PA-C  losartan (COZAAR) 100 MG tablet Take 100 mg by mouth daily. 01/18/15  Yes Historical Provider, MD  omeprazole (PRILOSEC) 20 MG capsule Take 20 mg by mouth daily. 01/18/15  Yes Historical Provider, MD  sertraline (ZOLOFT) 25 MG tablet Take 1 tablet (25 mg total) by mouth daily. 05/27/15  Yes Arnetha Courser, MD  Cholecalciferol (VITAMIN D-1000 MAX ST) 1000 UNITS tablet Take by mouth.    Historical Provider, MD  fluticasone (FLONASE) 50 MCG/ACT nasal spray USE 1 SPRAY IN EACH NOSTRIL EVERY DAY 03/29/15   Historical Provider, MD  ibuprofen (ADVIL,MOTRIN) 200 MG tablet Take 200 mg by mouth every 6 (six) hours as needed.    Historical Provider, MD  rOPINIRole (REQUIP) 0.25 MG tablet Take 0.25 mg by mouth as needed.    Historical Provider, MD    Allergies as of 09/12/2015  . (No Known Allergies)    Family History  Problem Relation Age of Onset  . COPD Mother   . Hypertension Mother   . Heart attack Father   . Heart disease Father   .  Hypertension Father   . Diabetes Maternal Uncle   . Cancer Maternal Grandmother     colon  . Kidney disease Neg Hx   . Prostate cancer Neg Hx     Social History   Social History  . Marital Status: Married    Spouse Name: N/A  . Number of Children: N/A  . Years of Education: N/A   Occupational History  . Not on file.   Social History Main Topics  . Smoking status: Current Every Day Smoker -- 1.00 packs/day for 30 years    Types: Cigarettes  . Smokeless tobacco: Never Used  . Alcohol Use: No  . Drug Use: No  . Sexual Activity: Not on file   Other Topics Concern  . Not on file   Social History Narrative    Review of Systems: See HPI, otherwise negative ROS  Physical Exam: BP 149/88 mmHg  Pulse 70  Temp(Src) 98.4 F (36.9 C) (Temporal)  Resp 16  Ht 6' (1.829 m)  Wt 175 lb (79.379 kg)  BMI 23.73 kg/m2  SpO2 98% General:   Alert,  pleasant and cooperative in NAD Head:  Normocephalic and atraumatic. Neck:  Supple; no masses or thyromegaly. Lungs:  Clear throughout to  auscultation.    Heart:  Regular rate and rhythm. Abdomen:  Soft, nontender and nondistended. Normal bowel sounds, without guarding, and without rebound.   Neurologic:  Alert and  oriented x4;  grossly normal neurologically.  Impression/Plan: Philip Lynch is here for an colonoscopy to be performed for hx of polyps  Risks, benefits, limitations, and alternatives regarding  colonoscopy have been reviewed with the patient.  Questions have been answered.  All parties agreeable.   Ollen Bowl, MD  10/06/2015, 8:15 AM

## 2015-10-06 NOTE — Anesthesia Preprocedure Evaluation (Signed)
Anesthesia Evaluation  Patient identified by MRN, date of birth, ID band  Reviewed: Allergy & Precautions, H&P , NPO status , Patient's Chart, lab work & pertinent test results  Airway Mallampati: II  TM Distance: >3 FB Neck ROM: full    Dental no notable dental hx.    Pulmonary Current Smoker,    Pulmonary exam normal        Cardiovascular hypertension,  Rhythm:regular Rate:Normal     Neuro/Psych    GI/Hepatic GERD  ,  Endo/Other    Renal/GU      Musculoskeletal   Abdominal   Peds  Hematology   Anesthesia Other Findings   Reproductive/Obstetrics                             Anesthesia Physical Anesthesia Plan  ASA: II  Anesthesia Plan: MAC   Post-op Pain Management:    Induction:   Airway Management Planned:   Additional Equipment:   Intra-op Plan:   Post-operative Plan:   Informed Consent: I have reviewed the patients History and Physical, chart, labs and discussed the procedure including the risks, benefits and alternatives for the proposed anesthesia with the patient or authorized representative who has indicated his/her understanding and acceptance.     Plan Discussed with: CRNA  Anesthesia Plan Comments:         Anesthesia Quick Evaluation

## 2015-10-06 NOTE — Transfer of Care (Signed)
Immediate Anesthesia Transfer of Care Note  Patient: Philip Lynch  Procedure(s) Performed: Procedure(s): COLONOSCOPY WITH PROPOFOL (N/A) POLYPECTOMY  Patient Location: PACU  Anesthesia Type: MAC  Level of Consciousness: awake, alert  and patient cooperative  Airway and Oxygen Therapy: Patient Spontanous Breathing and Patient connected to supplemental oxygen  Post-op Assessment: Post-op Vital signs reviewed, Patient's Cardiovascular Status Stable, Respiratory Function Stable, Patent Airway and No signs of Nausea or vomiting  Post-op Vital Signs: Reviewed and stable  Complications: No apparent anesthesia complications

## 2015-10-06 NOTE — Anesthesia Postprocedure Evaluation (Signed)
Anesthesia Post Note  Patient: Philip Lynch  Procedure(s) Performed: Procedure(s) (LRB): COLONOSCOPY WITH PROPOFOL (N/A) POLYPECTOMY  Patient location during evaluation: PACU Anesthesia Type: MAC Level of consciousness: awake and alert and oriented Pain management: satisfactory to patient Vital Signs Assessment: post-procedure vital signs reviewed and stable Respiratory status: spontaneous breathing, nonlabored ventilation and respiratory function stable Cardiovascular status: blood pressure returned to baseline and stable Postop Assessment: Adequate PO intake and No signs of nausea or vomiting Anesthetic complications: no    Raliegh Ip

## 2015-10-07 ENCOUNTER — Encounter: Payer: Self-pay | Admitting: Gastroenterology

## 2015-10-09 ENCOUNTER — Encounter: Payer: Self-pay | Admitting: Gastroenterology

## 2015-10-17 ENCOUNTER — Other Ambulatory Visit: Payer: BC Managed Care – PPO

## 2015-10-17 DIAGNOSIS — E291 Testicular hypofunction: Secondary | ICD-10-CM

## 2015-10-18 ENCOUNTER — Telehealth: Payer: Self-pay

## 2015-10-18 DIAGNOSIS — E291 Testicular hypofunction: Secondary | ICD-10-CM

## 2015-10-18 LAB — TESTOSTERONE: Testosterone: 1033 ng/dL (ref 348–1197)

## 2015-10-18 NOTE — Telephone Encounter (Signed)
Spoke with pt in reference to testosterone results. Pt assured nurse he is only taking 1/2 pill daily. Pt will RTC 12/27/15 for labs. Orders placed.

## 2015-10-18 NOTE — Telephone Encounter (Signed)
-----   Message from Nori Riis, PA-C sent at 10/18/2015  8:18 AM EST ----- Patient's testosterone is normal.  Is he taking 1/2 tablet daily?  We will need to have him come in for blood work in 2 months.   I will need lipids, testosterone and HCT.  It will have to be before 9 AM.

## 2015-11-09 ENCOUNTER — Telehealth: Payer: Self-pay

## 2015-11-09 NOTE — Telephone Encounter (Signed)
Patient called needs print out of fees paid for tax purposes please print Epic charges for him and I will mail them. Thanks!

## 2015-12-27 ENCOUNTER — Other Ambulatory Visit: Payer: BC Managed Care – PPO

## 2015-12-27 DIAGNOSIS — E291 Testicular hypofunction: Secondary | ICD-10-CM

## 2015-12-28 ENCOUNTER — Telehealth: Payer: Self-pay

## 2015-12-28 LAB — HEMATOCRIT: HEMATOCRIT: 45.1 % (ref 37.5–51.0)

## 2015-12-28 LAB — LIPID PANEL
CHOLESTEROL TOTAL: 204 mg/dL — AB (ref 100–199)
Chol/HDL Ratio: 4.6 ratio units (ref 0.0–5.0)
HDL: 44 mg/dL (ref >39)
LDL CALC: 128 mg/dL — AB (ref 0–99)
Triglycerides: 158 mg/dL — ABNORMAL HIGH (ref 0–149)
VLDL CHOLESTEROL CAL: 32 mg/dL (ref 5–40)

## 2015-12-28 LAB — TESTOSTERONE: TESTOSTERONE: 1045 ng/dL (ref 348–1197)

## 2015-12-28 NOTE — Telephone Encounter (Signed)
-----   Message from Nori Riis, PA-C sent at 12/28/2015 10:40 AM EST ----- Patient's labs look good. I would like to see him in April.

## 2015-12-28 NOTE — Telephone Encounter (Signed)
LMOM- labs good. Will see in April.

## 2016-01-21 IMAGING — US US ABDOMEN LIMITED
1 series · 14 of 25 positions shown · non-contrast
Comparison: CT Abdomen and Pelvis 07/01/2010

CLINICAL DATA: 58-year-old male with elevated alkaline phosphatase.
Initial encounter.

EXAM:
US ABDOMEN LIMITED - RIGHT UPPER QUADRANT

[Series 1: us abdomen limited · 0.23mm/px · 14 of 43 slices shown]
[im 1/43]
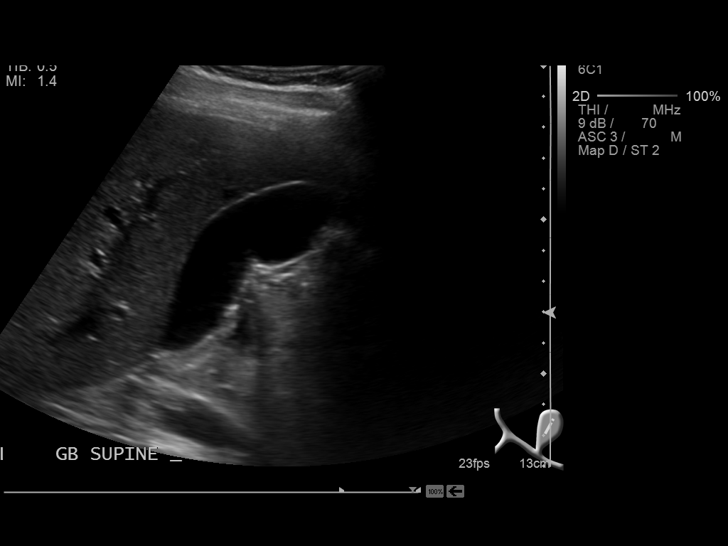
[im 4/43]
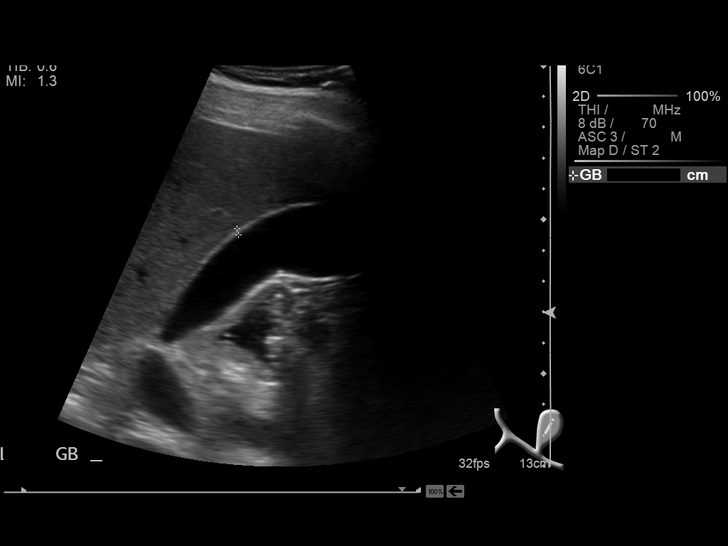
[im 8/43]
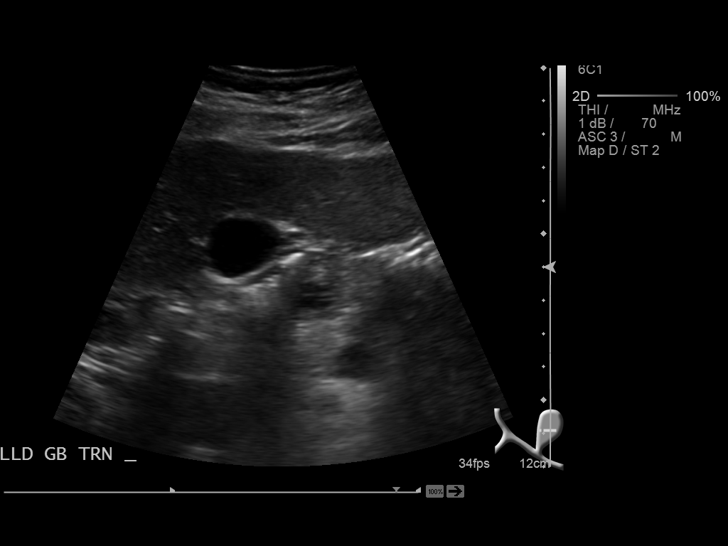
[im 11/43]
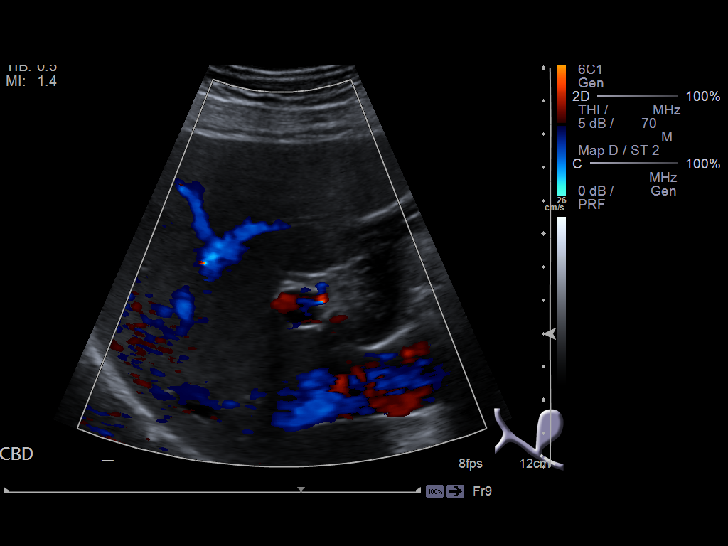
[im 15/43]
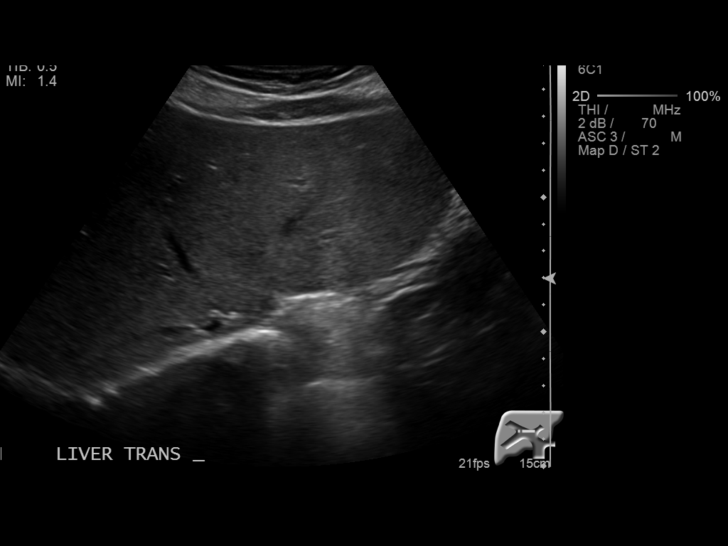
[im 16/43]
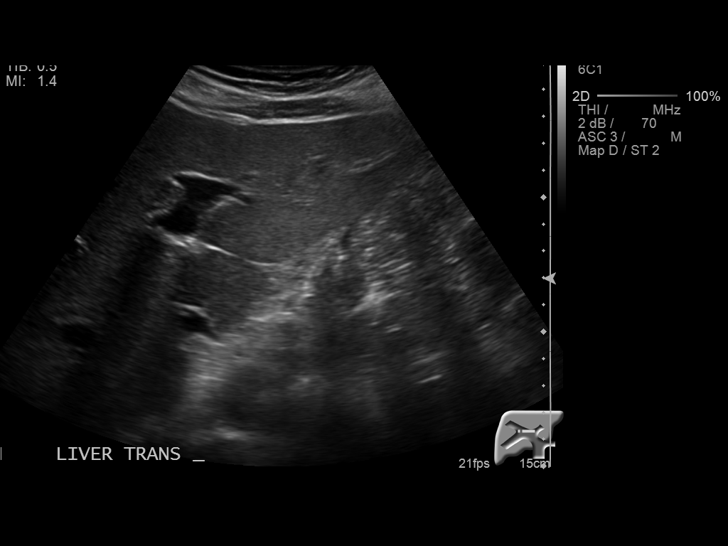
[im 20/43]
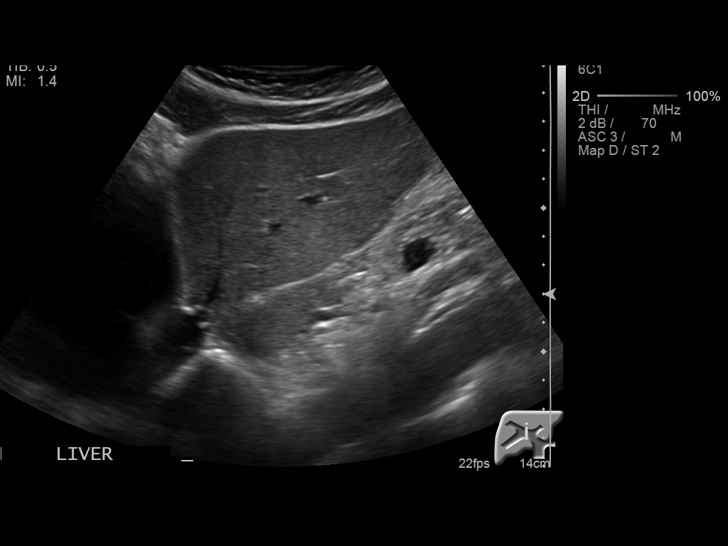
[im 23/43]
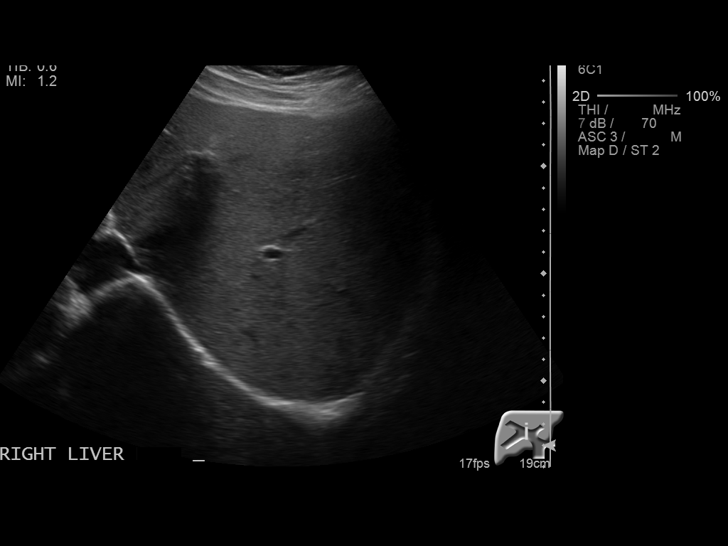
[im 27/43]
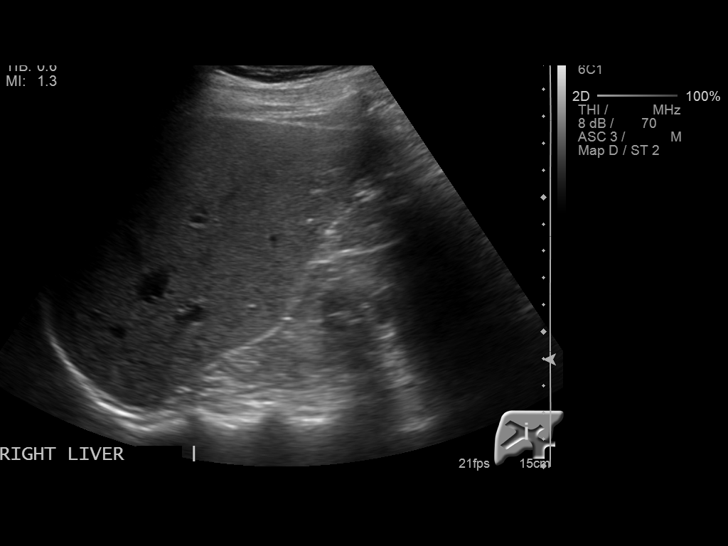
[im 29/43]
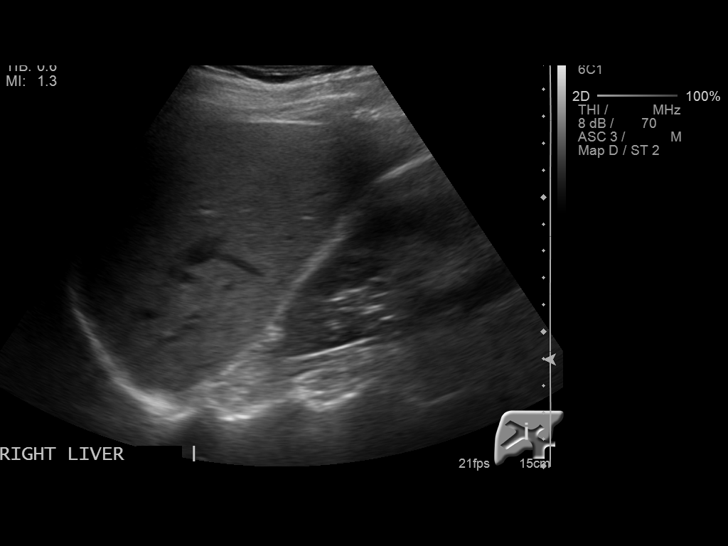
[im 32/43]
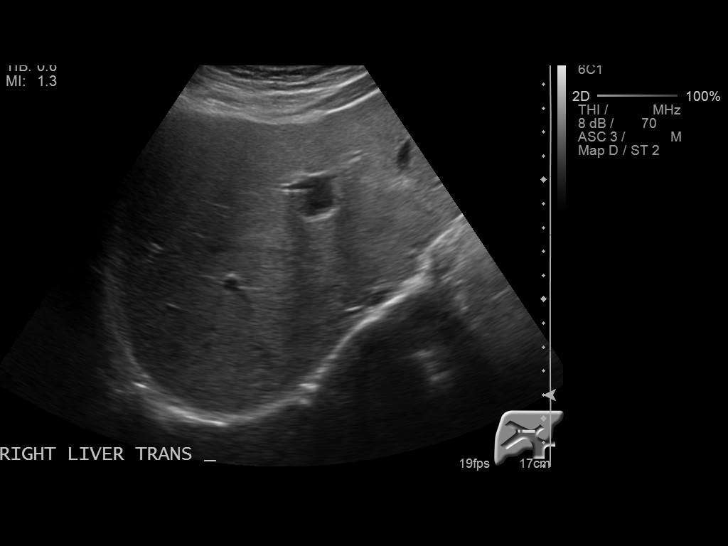
[im 36/43]
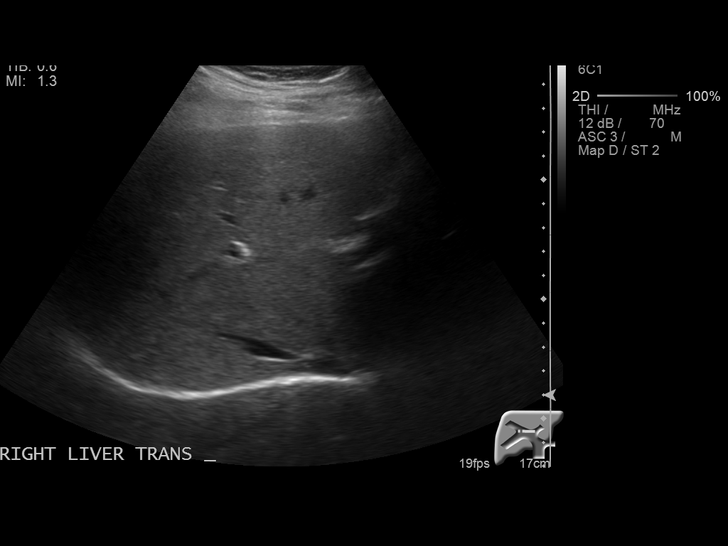
[im 39/43]
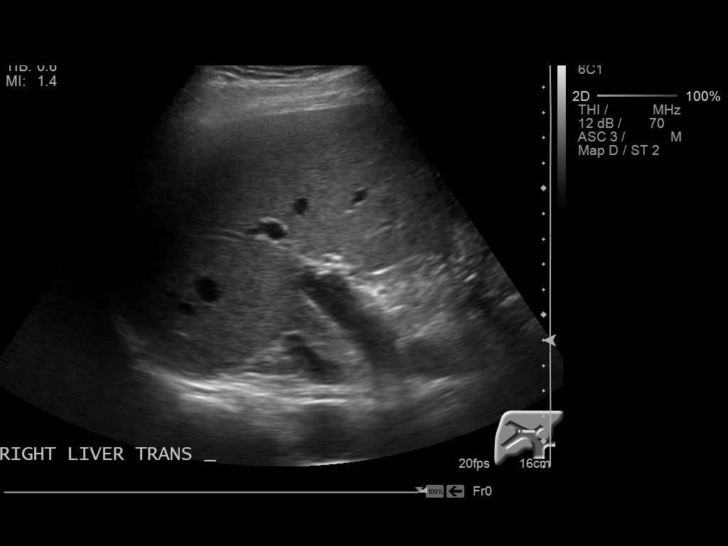
[im 43/43]
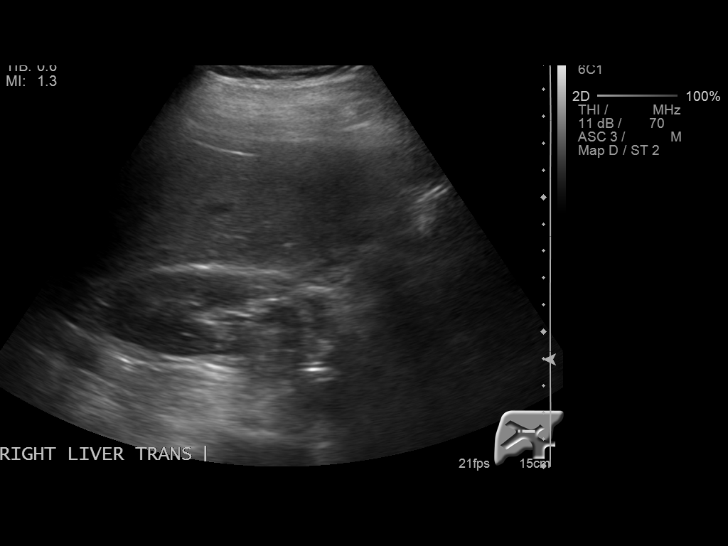

[14 of 25 positions shown; findings below may reference images not displayed]

FINDINGS: Gallbladder:

No gallstones or wall thickening visualized. No sonographic Murphy
sign noted.

Common bile duct:

Diameter: 3 mm, normal

Liver:

No focal lesion identified. Within normal limits in parenchymal
echogenicity.

Other findings: Negative visible right kidney
IMPRESSION: Normal sonographic appearance of the liver and gallbladder.

## 2016-01-23 ENCOUNTER — Other Ambulatory Visit: Payer: Self-pay | Admitting: Family Medicine

## 2016-02-13 ENCOUNTER — Ambulatory Visit (INDEPENDENT_AMBULATORY_CARE_PROVIDER_SITE_OTHER): Payer: BC Managed Care – PPO | Admitting: Urology

## 2016-02-13 ENCOUNTER — Encounter: Payer: Self-pay | Admitting: Urology

## 2016-02-13 VITALS — BP 151/76 | HR 72 | Ht 72.0 in | Wt 190.3 lb

## 2016-02-13 DIAGNOSIS — E291 Testicular hypofunction: Secondary | ICD-10-CM

## 2016-02-13 DIAGNOSIS — F528 Other sexual dysfunction not due to a substance or known physiological condition: Secondary | ICD-10-CM | POA: Diagnosis not present

## 2016-02-13 DIAGNOSIS — N400 Enlarged prostate without lower urinary tract symptoms: Secondary | ICD-10-CM

## 2016-02-13 DIAGNOSIS — F5221 Male erectile disorder: Secondary | ICD-10-CM

## 2016-02-13 NOTE — Progress Notes (Signed)
9:23 AM   Philip Lynch Dec 04, 1955 [id number]  Referring provider: Arnetha Courser, MD [address] [place], [place] [address]  Chief Complaint  Patient presents with  . Follow-up    BPH, Hypogonadism     HPI: Patient is a 60 year old white male who was found to be hypogonadal by his primary care physician, Dr. Robby Sermon, and was initiated on Clomid therapy.  He presents today for 6 month follow-up.   Hypogonadism Patient was initiated on Clomid 50 mg tablets, one half tablet daily in November 2016.  He has noticed an improvement in all the parameters as indicated by the [employer].  His pretreatment testosterone level was 210 ng/dL on 08/04/2015 and 191.6 ng/dL 08/12/2015.  His FSH, LH and prolactin are 4.8, 2.2 and 8.9, respectively.  His serum testosterone 1 month ago was 1045 ng/dL.      Androgen Deficiency in the Aging Male      02/13/16 0900       Androgen Deficiency in the Aging Male   Do you have a decrease in libido (sex drive) No     Do you have lack of energy No     Do you have a decrease in strength and/or endurance No     Have you lost height No     Have you noticed a decreased "enjoyment of life" No     Are you sad and/or grumpy No     Are your erections less strong No     Have you noticed a recent deterioration in your ability to play sports No     Are you falling asleep after dinner No     Has there been a recent deterioration in your work performance No        BPH WITH LUTS His IPSS score today is 3, which is mild lower urinary tract symptomatology. He is delighted with his quality life due to his urinary symptoms.  He denies any dysuria, hematuria or suprapubic pain.  He also denies any recent fevers, chills, nausea or vomiting.  He does not have a family history of PCa.      IPSS      02/13/16 0900       International Prostate Symptom Score   How often have you had the sensation of not emptying your bladder? Not at All     How  often have you had to urinate less than every two hours? Less than half the time     How often have you found you stopped and started again several times when you urinated? Not at All     How often have you found it difficult to postpone urination? Not at All     How often have you had a weak urinary stream? Not at All     How often have you had to strain to start urination? Not at All     How many times did you typically get up at night to urinate? 1 Time     Total IPSS Score 3     Quality of Life due to urinary symptoms   If you were to spend the rest of your life with your urinary condition just the way it is now how would you feel about that? Delighted        Score:  1-7 Mild 8-19 Moderate 20-35 Severe  Erectile dysfunction His SHIM score is 22, which is no erectile dysfunction.   He does complain of  some situations where his erections are not firm enough for penetration, but it is not impacting his sex life at this time.      SHIM      02/13/16 0908       SHIM: Over the last 6 months:   How do you rate your confidence that you could get and keep an erection? Moderate     When you had erections with sexual stimulation, how often were your erections hard enough for penetration (entering your partner)? Most Times (much more than half the time)     During sexual intercourse, how often were you able to maintain your erection after you had penetrated (entered) your partner? Not Difficult     During sexual intercourse, how difficult was it to maintain your erection to completion of intercourse? Not Difficult     When you attempted sexual intercourse, how often was it satisfactory for you? Not Difficult     SHIM Total Score   SHIM 22        Score: 1-7 Severe ED 8-11 Moderate ED 12-16 Mild-Moderate ED 17-21 Mild ED 22-25 No ED      PMH: Past Medical History  Diagnosis Date  . Colon polyp   . Hypertension   . Arthritis     knees  . Acid reflux   . RLS (restless legs  syndrome)   . Heart burn   . Anxiety   . Depression   . History of kidney stones   . Vertigo     (x1) several months ago    Surgical History: Past Surgical History  Procedure Laterality Date  . Colonoscopy  2012    Dr. Tobias Alexander  . Upper gi endoscopy  2012  . Hernia repair Right 02/01/15    inguinal   . Colonoscopy with propofol N/A 10/06/2015    Procedure: COLONOSCOPY WITH PROPOFOL;  Surgeon: Lucilla Lame, MD;  Location: [facility];  Service: Endoscopy;  Laterality: N/A;  . Polypectomy  10/06/2015    Procedure: POLYPECTOMY;  Surgeon: Lucilla Lame, MD;  Location: [facility];  Service: Endoscopy;;    Home Medications:    Medication List       This list is accurate as of: 02/13/16  9:23 AM.  Always use your most recent med list.               clomiPHENE 50 MG tablet  Commonly known as:  CLOMID  Take 1 tablet (50 mg total) by mouth daily. Take one half tablet daily     fluticasone 50 MCG/ACT nasal spray  Commonly known as:  FLONASE  Reported on 02/13/2016     ibuprofen 200 MG tablet  Commonly known as:  ADVIL,MOTRIN  Take 200 mg by mouth every 6 (six) hours as needed.     losartan 100 MG tablet  Commonly known as:  COZAAR  Take 100 mg by mouth daily.     omeprazole 20 MG capsule  Commonly known as:  PRILOSEC  Take 20 mg by mouth daily.     sertraline 25 MG tablet  Commonly known as:  ZOLOFT  TAKE 1 TABLET (25 MG TOTAL) BY MOUTH DAILY.     VITAMIN D-1000 MAX ST 1000 units tablet  Generic drug:  Cholecalciferol  Take by mouth.        Allergies: No Known Allergies  Family History: Family History  Problem Relation Age of Onset  . COPD Mother   . Hypertension Mother   . Heart attack Father   .  Heart disease Father   . Hypertension Father   . Diabetes Maternal Uncle   . Cancer Maternal Grandmother     colon  . Kidney disease Neg Hx   . Prostate cancer Neg Hx     Social History:  reports that he has been smoking Cigarettes.  He has a  30 pack-year smoking history. He has never used smokeless tobacco. He reports that he does not drink alcohol or use illicit drugs.  ROS: UROLOGY Frequent Urination?: No Hard to postpone urination?: No Burning/pain with urination?: No Get up at night to urinate?: No Leakage of urine?: No Urine stream starts and stops?: No Trouble starting stream?: No Do you have to strain to urinate?: No Blood in urine?: No Urinary tract infection?: No Sexually transmitted disease?: No Injury to kidneys or bladder?: No Painful intercourse?: No Weak stream?: No Erection problems?: No Penile pain?: No  Gastrointestinal Nausea?: No Vomiting?: No Indigestion/heartburn?: No Diarrhea?: No Constipation?: No  Constitutional Fever: No Night sweats?: No Weight loss?: No Fatigue?: No  Skin Skin rash/lesions?: No Itching?: No  Eyes Blurred vision?: No Double vision?: No  Ears/Nose/Throat Sore throat?: No Sinus problems?: Yes  Hematologic/Lymphatic Swollen glands?: No Easy bruising?: No  Cardiovascular Leg swelling?: No Chest pain?: No  Respiratory Cough?: No Shortness of breath?: No  Endocrine Excessive thirst?: No  Musculoskeletal Back pain?: Yes Joint pain?: No  Neurological Headaches?: No Dizziness?: No  Psychologic Depression?: No Anxiety?: No  Physical Exam: BP 151/76 mmHg  Pulse 72  Ht 6' (1.829 m)  Wt 190 lb 4.8 oz (86.32 kg)  BMI 25.80 kg/m2  Constitutional: Well nourished. Alert and oriented, No acute distress. HEENT: [place] AT, moist mucus membranes. Trachea midline, no masses. Cardiovascular: No clubbing, cyanosis, or edema. Respiratory: Normal respiratory effort, no increased work of breathing. GI: Abdomen is soft, non tender, non distended, no abdominal masses. Liver and spleen not palpable.  No hernias appreciated.  Stool sample for occult testing is not indicated.   GU: No CVA tenderness.  No bladder fullness or masses.  Patient with circumcised  phallus.  Urethral meatus is patent.  No penile discharge. No penile lesions or rashes. Scrotum without lesions, cysts, rashes and/or edema.  Testicles are located scrotally bilaterally. No masses are appreciated in the testicles. Left and right epididymis are normal. Rectal: Patient with  normal sphincter tone. Anus and perineum without scarring or rashes. No rectal masses are appreciated. Prostate is approximately 50 grams, no nodules are appreciated. Seminal vesicles are normal. Skin: No rashes, bruises or suspicious lesions. Lymph: No cervical or inguinal adenopathy. Neurologic: Grossly intact, no focal deficits, moving all 4 extremities. Psychiatric: Normal mood and affect.  Laboratory Data: Lab Results  Component Value Date   WBC 8.7 04/26/2015   HGB 14.9 04/26/2015   HCT 45.1 12/27/2015   MCV 90.8 04/26/2015   PLT 316 04/26/2015    Lab Results  Component Value Date   CREATININE 0.80 04/26/2015    Lab Results  Component Value Date   HGBA1C 5.9* 04/28/2015     Assessment & Plan:    1. Hypogonadism:   Patient will continue Clomid 50 mg to take one half tab day.  A serum testosterone, hematocrit, estradiol and TSH are drawn today.  If labs return within normal values, he will return in 6 months for ADAM questionnaire, exam, morning testosterone before 9 AM, PSA, LFT's and HCT.      2. BPH with obstruction/lower urinary tract symptoms:   IPSS score is 3/0.  We will continue to monitor as testosterone therapy has the potential to increased prostate size and worsened lower urinary tract symptoms.  He will RTC in 6 months for IPSS score, PSA and exam.    - PSA  3. Erectile dysfunction:   SHIM score is 22.  We will continue to monitor his testosterone therapy can affect erectile function.  He will RTC in 6 months for Shim score and exam.   Return in about 6 months (around 08/14/2016) for IPSS, SHIM, ADAM and exam.  Zara Council, [facility]  [facility] [address], [place], [place] [address] [phone]

## 2016-02-14 ENCOUNTER — Telehealth: Payer: Self-pay

## 2016-02-14 NOTE — Telephone Encounter (Signed)
-----   Message from Nori Riis, PA-C sent at 02/14/2016  8:36 AM EDT ----- PSA is stable.  We will see him in six months.

## 2016-02-14 NOTE — Telephone Encounter (Signed)
Notified pt of stable PSA & of lab & office visit scheduled for 07/2016 pt voices understanding.

## 2016-02-14 NOTE — Telephone Encounter (Signed)
LMOM

## 2016-02-16 ENCOUNTER — Telehealth: Payer: Self-pay

## 2016-02-16 NOTE — Telephone Encounter (Signed)
-----   Message from Nori Riis, PA-C sent at 02/14/2016  8:36 AM EDT ----- PSA is stable.  We will see him in six months.

## 2016-02-16 NOTE — Telephone Encounter (Signed)
LMOM-most recent labs are normal. F/u in 12mo.

## 2016-02-21 LAB — ESTRADIOL, FREE
ESTRADIOL: 45 pg/mL
FREE ESTRADIOL, PERCENT: 2 %
Free Estradiol, Serum: 0.9 pg/mL

## 2016-02-21 LAB — TESTOSTERONE: TESTOSTERONE: 1058 ng/dL (ref 348–1197)

## 2016-02-21 LAB — PSA: Prostate Specific Ag, Serum: 2.6 ng/mL (ref 0.0–4.0)

## 2016-02-21 LAB — TSH: TSH: 1.58 u[IU]/mL (ref 0.450–4.500)

## 2016-02-21 LAB — HEMATOCRIT: HEMATOCRIT: 44.7 % (ref 37.5–51.0)

## 2016-02-24 ENCOUNTER — Encounter: Payer: Self-pay | Admitting: Family Medicine

## 2016-02-24 ENCOUNTER — Ambulatory Visit (INDEPENDENT_AMBULATORY_CARE_PROVIDER_SITE_OTHER): Payer: BC Managed Care – PPO | Admitting: Family Medicine

## 2016-02-24 VITALS — BP 142/88 | HR 83 | Temp 98.3°F | Resp 14 | Ht 72.0 in | Wt 187.0 lb

## 2016-02-24 DIAGNOSIS — E785 Hyperlipidemia, unspecified: Secondary | ICD-10-CM | POA: Diagnosis not present

## 2016-02-24 DIAGNOSIS — Z720 Tobacco use: Secondary | ICD-10-CM

## 2016-02-24 DIAGNOSIS — R7309 Other abnormal glucose: Secondary | ICD-10-CM

## 2016-02-24 DIAGNOSIS — I10 Essential (primary) hypertension: Secondary | ICD-10-CM | POA: Diagnosis not present

## 2016-02-24 DIAGNOSIS — R748 Abnormal levels of other serum enzymes: Secondary | ICD-10-CM

## 2016-02-24 DIAGNOSIS — R739 Hyperglycemia, unspecified: Secondary | ICD-10-CM | POA: Diagnosis not present

## 2016-02-24 MED ORDER — ASPIRIN EC 81 MG PO TBEC: 81.0000 mg | DELAYED_RELEASE_TABLET | Freq: Every day | ORAL | Status: AC

## 2016-02-24 NOTE — Assessment & Plan Note (Signed)
Work on DASH guidelines 

## 2016-02-24 NOTE — Patient Instructions (Addendum)
I do encourage you to quit smoking Call [phone] to sign up for smoking cessation classes You can call 1-800-QUIT-NOW to talk with a smoking cessation coach  Your goal blood pressure is less than 150 mmHg on top. Try to follow the DASH guidelines (DASH stands for Dietary Approaches to Stop Hypertension) Try to limit the sodium in your diet.  Ideally, consume less than 1.5 grams (less than 1,500mg ) per day. Do not add salt when cooking or at the table.  Check the sodium amount on labels when shopping, and choose items lower in sodium when given a choice. Avoid or limit foods that already contain a lot of sodium. Eat a diet rich in fruits and vegetables and whole grains.  Start a baby coated aspirin daily  Have fasting labs done next week some time  If you have not heard anything from my staff in a week about any orders/referrals/studies from today, please contact us here to follow-up [phone]  Smoking Cessation, Tips for Success If you are ready to quit smoking, congratulations! You have chosen to help yourself be healthier. Cigarettes bring nicotine, tar, carbon monoxide, and other irritants into your body. Your lungs, heart, and blood vessels will be able to work better without these poisons. There are many different ways to quit smoking. Nicotine gum, nicotine patches, a nicotine inhaler, or nicotine nasal spray can help with physical craving. Hypnosis, support groups, and medicines help break the habit of smoking. WHAT THINGS CAN I DO TO MAKE QUITTING EASIER?  Here are some tips to help you quit for good:  Pick a date when you will quit smoking completely. Tell all of your friends and family about your plan to quit on that date.  Do not try to slowly cut down on the number of cigarettes you are smoking. Pick a quit date and quit smoking completely starting on that day.  Throw away all cigarettes.   Clean and remove all ashtrays from your home, work, and car.  On a card,  write down your reasons for quitting. Carry the card with you and read it when you get the urge to smoke.  Cleanse your body of nicotine. Drink enough water and fluids to keep your urine clear or pale yellow. Do this after quitting to flush the nicotine from your body.  Learn to predict your moods. Do not let a bad situation be your excuse to have a cigarette. Some situations in your life might tempt you into wanting a cigarette.  Never have "just one" cigarette. It leads to wanting another and another. Remind yourself of your decision to quit.  Change habits associated with smoking. If you smoked while driving or when feeling stressed, try other activities to replace smoking. Stand up when drinking your coffee. Brush your teeth after eating. Sit in a different chair when you read the paper. Avoid alcohol while trying to quit, and try to drink fewer caffeinated beverages. Alcohol and caffeine may urge you to smoke.  Avoid foods and drinks that can trigger a desire to smoke, such as sugary or spicy foods and alcohol.  Ask people who smoke not to smoke around you.  Have something planned to do right after eating or having a cup of coffee. For example, plan to take a walk or exercise.  Try a relaxation exercise to calm you down and decrease your stress. Remember, you may be tense and nervous for the first 2 weeks after you quit, but this will pass.  Find new  activities to keep your hands busy. Play with a pen, coin, or rubber band. Doodle or draw things on paper.  Brush your teeth right after eating. This will help cut down on the craving for the taste of tobacco after meals. You can also try mouthwash.   Use oral substitutes in place of cigarettes. Try using lemon drops, carrots, cinnamon sticks, or chewing gum. Keep them handy so they are available when you have the urge to smoke.  When you have the urge to smoke, try deep breathing.  Designate your home as a nonsmoking area.  If you are  a heavy smoker, ask your health care provider about a prescription for nicotine chewing gum. It can ease your withdrawal from nicotine.  Reward yourself. Set aside the cigarette money you save and buy yourself something nice.  Look for support from others. Join a support group or smoking cessation program. Ask someone at home or at work to help you with your plan to quit smoking.  Always ask yourself, "Do I need this cigarette or is this just a reflex?" Tell yourself, "Today, I choose not to smoke," or "I do not want to smoke." You are reminding yourself of your decision to quit.  Do not replace cigarette smoking with electronic cigarettes (commonly called e-cigarettes). The safety of e-cigarettes is unknown, and some may contain harmful chemicals.  If you relapse, do not give up! Plan ahead and think about what you will do the next time you get the urge to smoke. HOW WILL I FEEL WHEN I QUIT SMOKING? You may have symptoms of withdrawal because your body is used to nicotine (the addictive substance in cigarettes). You may crave cigarettes, be irritable, feel very hungry, cough often, get headaches, or have difficulty concentrating. The withdrawal symptoms are only temporary. They are strongest when you first quit but will go away within 10-14 days. When withdrawal symptoms occur, stay in control. Think about your reasons for quitting. Remind yourself that these are signs that your body is healing and getting used to being without cigarettes. Remember that withdrawal symptoms are easier to treat than the major diseases that smoking can cause.  Even after the withdrawal is over, expect periodic urges to smoke. However, these cravings are generally short lived and will go away whether you smoke or not. Do not smoke! WHAT RESOURCES ARE AVAILABLE TO HELP ME QUIT SMOKING? Your health care provider can direct you to community resources or hospitals for support, which may include:  Group  support.  Education.  Hypnosis.  Therapy.   This information is not intended to replace advice given to you by your health care provider. Make sure you discuss any questions you have with your health care provider.   Document Released: 07/13/2004 Document Revised: 11/05/2014 Document Reviewed: 04/02/2013 Elsevier Interactive Patient Education [employer].

## 2016-02-24 NOTE — Assessment & Plan Note (Signed)
Recheck a1c; limit sweets

## 2016-02-24 NOTE — Assessment & Plan Note (Signed)
encoruaged smoking cessation

## 2016-02-24 NOTE — Assessment & Plan Note (Signed)
Check A1c; limit sweets 

## 2016-02-24 NOTE — Assessment & Plan Note (Signed)
Recheck fasting

## 2016-02-24 NOTE — Progress Notes (Signed)
BP 142/88 mmHg  Pulse 83  Temp(Src) 98.3 F (36.8 C) (Oral)  Resp 14  Ht 6' (1.829 m)  Wt 187 lb (84.823 kg)  BMI 25.36 kg/m2  SpO2 95%   Subjective:    Patient ID: Philip Lynch, male    DOB: 1956-08-27, 60 y.o.   MRN: [id number]  HPI: Philip Lynch is a 60 y.o. male  Chief Complaint  Patient presents with  . Follow-up    6 months    He feels the best he has felt in a while  He saw the urologist and on the clomid and doing great (for hypogonadism)  Allergies; not too bad overall; using the fluticasone; still sneezing; typical for spring; takes claritin if he needs to  High blood pressure; checks his BP at times; goes up with stress; low 140s/70-80s; rarely in the low 90s and that is with stress; he does not add salt at home; wife does not cook with salt  Anxiety; doing well with sertraline  GERD; still on omeprazole; doing well with that; does not skip days; prescribed by Dr. Allen Norris; he says to keep taking it; did the colonoscopy, two polyps  A1c in prediabetes range, 5.9 in June; uncle and cousin had diabetes; drinks sweet tea  Depression screen [facility] 2/9 02/24/2016  Decreased Interest 0  Down, Depressed, Hopeless 0  PHQ - 2 Score 0   Relevant past medical, surgical, family and social history reviewed Past Medical History  Diagnosis Date  . Colon polyp   . Hypertension   . Arthritis     knees  . Acid reflux   . RLS (restless legs syndrome)   . Heart burn   . Anxiety   . Depression   . History of kidney stones   . Vertigo     (x1) several months ago   Past Surgical History  Procedure Laterality Date  . Colonoscopy  2012    Dr. Tobias Alexander  . Upper gi endoscopy  2012  . Hernia repair Right 02/01/15    inguinal   . Colonoscopy with propofol N/A 10/06/2015    Procedure: COLONOSCOPY WITH PROPOFOL;  Surgeon: Lucilla Lame, MD;  Location: [place];  Service: Endoscopy;  Laterality: N/A;  . Polypectomy  10/06/2015    Procedure: POLYPECTOMY;  Surgeon:  Lucilla Lame, MD;  Location: [place];  Service: Endoscopy;;   Family History  Problem Relation Age of Onset  . COPD Mother   . Hypertension Mother   . Heart attack Father   . Heart disease Father   . Hypertension Father   . Diabetes Maternal Uncle   . Cancer Maternal Grandmother     colon  . Kidney disease Neg Hx   . Prostate cancer Neg Hx    Social History  Substance Use Topics  . Smoking status: Current Every Day Smoker -- 1.00 packs/day for 30 years    Types: Cigarettes  . Smokeless tobacco: Never Used  . Alcohol Use: No   Interim medical history since our last visit reviewed. Allergies and medications reviewed and updated.  Review of Systems Per HPI unless specifically indicated above     Objective:    BP 142/88 mmHg  Pulse 83  Temp(Src) 98.3 F (36.8 C) (Oral)  Resp 14  Ht 6' (1.829 m)  Wt 187 lb (84.823 kg)  BMI 25.36 kg/m2  SpO2 95%  Wt Readings from Last 3 Encounters:  02/24/16 187 lb (84.823 kg)  02/13/16 190 lb 4.8 oz (86.32  kg)  10/06/15 175 lb (79.379 kg)    Physical Exam  Constitutional: He appears well-developed and well-nourished. No distress.  HENT:  Head: Normocephalic and atraumatic.  Eyes: EOM are normal. No scleral icterus.  Neck: No JVD present.  Cardiovascular: Normal rate and regular rhythm.   Pulmonary/Chest: Effort normal and breath sounds normal.  Abdominal: Soft. Bowel sounds are normal.  Neurological: He displays no tremor.  Skin: He is not diaphoretic. No pallor.  Psychiatric: His mood appears not anxious. He does not exhibit a depressed mood.    Results for orders placed or performed in visit on 02/13/16  PSA  Result Value Ref Range   Prostate Specific Ag, Serum 2.6 0.0 - 4.0 ng/mL  Estradiol  Result Value Ref Range   Estradiol, Serum, MS 45 pg/mL   Free Estradiol, Percent 2.0 %   Free Estradiol, Serum 0.90 pg/mL  Hematocrit  Result Value Ref Range   Hematocrit 44.7 37.5 - 51.0 %  Testosterone  Result  Value Ref Range   Testosterone 1058 348 - 1197 ng/dL   Comment, Testosterone Comment   TSH  Result Value Ref Range   TSH 1.580 0.450 - 4.500 uIU/mL      Assessment & Plan:   Problem List Items Addressed This Visit      Cardiovascular and Mediastinum   Essential hypertension, benign - Primary    Work on DASH guidelines      Relevant Medications   aspirin EC 81 MG tablet     Other   Alkaline phosphatase elevation    Recheck fasting      Relevant Orders   Comprehensive metabolic panel   Dyslipidemia   Relevant Orders   Lipid Panel w/o Chol/HDL Ratio   Elevated hemoglobin A1c    Recheck a1c; limit sweets      Relevant Orders   Hgb A1c w/o eAG   Hyperglycemia    Check A1c; limit sweets      Tobacco abuse    encoruaged smoking cessation         Follow up plan: Return in about 6 months (around 08/25/2016) for visit and fasting labs.  An after-visit summary was printed and given to the patient at [facility].  Please see the patient instructions which may contain other information and recommendations beyond what is mentioned above in the assessment and plan.  Meds ordered this encounter  Medications  . aspirin EC 81 MG tablet    Sig: Take 1 tablet (81 mg total) by mouth daily. (if taking ibuprofen, take the aspirin at least 1 hour before the ibuprofen)    Dispense:  30 tablet    Refill:  11    Orders Placed This Encounter  Procedures  . Lipid Panel w/o Chol/HDL Ratio  . Comprehensive metabolic panel  . Hgb A1c w/o eAG

## 2016-03-18 ENCOUNTER — Encounter: Payer: Self-pay | Admitting: Family Medicine

## 2016-05-01 ENCOUNTER — Other Ambulatory Visit: Payer: Self-pay | Admitting: Urology

## 2016-06-26 ENCOUNTER — Telehealth: Payer: Self-pay | Admitting: Family Medicine

## 2016-06-26 NOTE — Telephone Encounter (Signed)
Please remind patient that he has outstanding labs from April, and we'd love to have him get those done soon Fasting Thank you

## 2016-06-27 NOTE — Telephone Encounter (Signed)
Left voice mail

## 2016-07-07 LAB — COMPREHENSIVE METABOLIC PANEL
ALBUMIN: 4.2 g/dL (ref 3.6–4.8)
ALK PHOS: 81 IU/L (ref 39–117)
ALT: 15 IU/L (ref 0–44)
AST: 24 IU/L (ref 0–40)
Albumin/Globulin Ratio: 1.6 (ref 1.2–2.2)
BILIRUBIN TOTAL: 0.5 mg/dL (ref 0.0–1.2)
BUN / CREAT RATIO: 14 (ref 10–24)
BUN: 15 mg/dL (ref 8–27)
CHLORIDE: 103 mmol/L (ref 96–106)
CO2: 25 mmol/L (ref 18–29)
CREATININE: 1.05 mg/dL (ref 0.76–1.27)
Calcium: 9.4 mg/dL (ref 8.6–10.2)
GFR calc Af Amer: 89 mL/min/{1.73_m2} (ref >59)
GFR calc non Af Amer: 77 mL/min/{1.73_m2} (ref >59)
GLUCOSE: 104 mg/dL — AB (ref 65–99)
Globulin, Total: 2.7 g/dL (ref 1.5–4.5)
Potassium: 4.5 mmol/L (ref 3.5–5.2)
Sodium: 141 mmol/L (ref 134–144)
Total Protein: 6.9 g/dL (ref 6.0–8.5)

## 2016-07-07 LAB — LIPID PANEL W/O CHOL/HDL RATIO
Cholesterol, Total: 236 mg/dL — ABNORMAL HIGH (ref 100–199)
HDL: 44 mg/dL (ref >39)
LDL CALC: 167 mg/dL — AB (ref 0–99)
Triglycerides: 125 mg/dL (ref 0–149)
VLDL Cholesterol Cal: 25 mg/dL (ref 5–40)

## 2016-07-07 LAB — HGB A1C W/O EAG: HEMOGLOBIN A1C: 5.7 % — AB (ref 4.8–5.6)

## 2016-07-17 ENCOUNTER — Encounter: Payer: Self-pay | Admitting: Family Medicine

## 2016-07-17 MED ORDER — ATORVASTATIN CALCIUM 40 MG PO TABS: 40.0000 mg | ORAL_TABLET | Freq: Every day | ORAL | 1 refills | Status: DC

## 2016-07-17 NOTE — Progress Notes (Signed)
Letter to pt re: labs

## 2016-07-30 ENCOUNTER — Other Ambulatory Visit: Payer: Self-pay

## 2016-07-30 MED ORDER — SERTRALINE HCL 25 MG PO TABS: 25.0000 mg | ORAL_TABLET | Freq: Every day | ORAL | 1 refills | Status: DC

## 2016-08-06 ENCOUNTER — Other Ambulatory Visit: Payer: Self-pay

## 2016-08-06 DIAGNOSIS — E291 Testicular hypofunction: Secondary | ICD-10-CM

## 2016-08-06 DIAGNOSIS — N401 Enlarged prostate with lower urinary tract symptoms: Secondary | ICD-10-CM

## 2016-08-07 ENCOUNTER — Other Ambulatory Visit: Payer: BC Managed Care – PPO

## 2016-08-07 ENCOUNTER — Encounter: Payer: Self-pay | Admitting: Urology

## 2016-08-13 ENCOUNTER — Other Ambulatory Visit: Payer: BC Managed Care – PPO

## 2016-08-13 DIAGNOSIS — N401 Enlarged prostate with lower urinary tract symptoms: Secondary | ICD-10-CM

## 2016-08-13 DIAGNOSIS — E291 Testicular hypofunction: Secondary | ICD-10-CM

## 2016-08-14 ENCOUNTER — Ambulatory Visit: Payer: BC Managed Care – PPO | Admitting: Urology

## 2016-08-14 ENCOUNTER — Encounter: Payer: Self-pay | Admitting: Urology

## 2016-08-14 VITALS — BP 166/84 | HR 62 | Ht 72.0 in | Wt 187.5 lb

## 2016-08-14 DIAGNOSIS — F5221 Male erectile disorder: Secondary | ICD-10-CM | POA: Diagnosis not present

## 2016-08-14 DIAGNOSIS — R972 Elevated prostate specific antigen [PSA]: Secondary | ICD-10-CM

## 2016-08-14 DIAGNOSIS — E291 Testicular hypofunction: Secondary | ICD-10-CM | POA: Diagnosis not present

## 2016-08-14 DIAGNOSIS — N400 Enlarged prostate without lower urinary tract symptoms: Secondary | ICD-10-CM

## 2016-08-14 LAB — TESTOSTERONE: Testosterone: 861 ng/dL (ref 264–916)

## 2016-08-14 LAB — HEPATIC FUNCTION PANEL
ALBUMIN: 4.4 g/dL (ref 3.6–4.8)
ALK PHOS: 92 IU/L (ref 39–117)
ALT: 17 IU/L (ref 0–44)
AST: 19 IU/L (ref 0–40)
BILIRUBIN TOTAL: 0.4 mg/dL (ref 0.0–1.2)
Bilirubin, Direct: 0.11 mg/dL (ref 0.00–0.40)
Total Protein: 7.3 g/dL (ref 6.0–8.5)

## 2016-08-14 LAB — PSA: Prostate Specific Ag, Serum: 2.9 ng/mL (ref 0.0–4.0)

## 2016-08-14 LAB — HEMATOCRIT: Hematocrit: 43.1 % (ref 37.5–51.0)

## 2016-08-14 MED ORDER — CLOMIPHENE CITRATE 50 MG PO TABS: 25.0000 mg | ORAL_TABLET | Freq: Every day | ORAL | 3 refills | Status: DC

## 2016-08-14 NOTE — Progress Notes (Signed)
3:58 PM   Philip Lynch 01-30-1956 [id number]  Referring provider: Arnetha Courser, MD [address] [place], [place] [address]  Chief Complaint  Patient presents with  . Hypogonadism    6 month follow up     HPI: Patient is a 60 year old Caucasian male who was found to be hypogonadal by his primary care physician, Dr. Sanda Klein, and was initiated on Clomid therapy.  He presents today for 6 month follow-up.  Hypogonadism Patient is experiencing a recent deterioration in an ability to play sports.  This is indicated by his responses to the ADAM questionnaire.   He is no longer having spontaneous erections at night.   He does not have sleep apnea.   His current testosterone level was 861 ng/dL on 08/13/2016.  He is currently managing his hypogonadism with Clomid 50 mg, 1/2 tablets daily.       Androgen Deficiency in the Aging Male    [place] Name 08/14/16 1500         Androgen Deficiency in the Aging Male   Do you have a decrease in libido (sex drive) No     Do you have lack of energy No     Do you have a decrease in strength and/or endurance No     Have you lost height No     Have you noticed a decreased "enjoyment of life" No     Are you sad and/or grumpy No     Are your erections less strong No     Have you noticed a recent deterioration in your ability to play sports Yes     Are you falling asleep after dinner No     Has there been a recent deterioration in your work performance No       BPH WITH LUTS His IPSS score today is 2, which is mild lower urinary tract symptomatology. He is delighted with his quality life due to his urinary symptoms.  His previous IPSS was 3/0.  He denies any dysuria, hematuria or suprapubic pain.  He also denies any recent fevers, chills, nausea or vomiting.  He does not have a family history of PCa.     IPSS    Row Name 08/14/16 1500         International Prostate Symptom Score   How often have you had the sensation of not emptying  your bladder? Not at All     How often have you had to urinate less than every two hours? Less than 1 in 5 times     How often have you found you stopped and started again several times when you urinated? Not at All     How often have you found it difficult to postpone urination? Not at All     How often have you had a weak urinary stream? Not at All     How often have you had to strain to start urination? Not at All     How many times did you typically get up at night to urinate? 1 Time     Total IPSS Score 2       Quality of Life due to urinary symptoms   If you were to spend the rest of your life with your urinary condition just the way it is now how would you feel about that? Delighted        Score:  1-7 Mild 8-19 Moderate 20-35 Severe  Erectile dysfunction His SHIM score  is 24, which is no erectile dysfunction.  His previous SHIM score was 22.   He does complain of some situations where his erections are not firm enough for penetration, but it is not impacting his sex life at this time.     SHIM    Row Name 08/14/16 1528         SHIM: Over the last 6 months:   How do you rate your confidence that you could get and keep an erection? High     When you had erections with sexual stimulation, how often were your erections hard enough for penetration (entering your partner)? Almost Always or Always     During sexual intercourse, how often were you able to maintain your erection after you had penetrated (entered) your partner? Not Difficult     During sexual intercourse, how difficult was it to maintain your erection to completion of intercourse? Not Difficult     When you attempted sexual intercourse, how often was it satisfactory for you? Not Difficult       SHIM Total Score   SHIM 24        Score: 1-7 Severe ED 8-11 Moderate ED 12-16 Mild-Moderate ED 17-21 Mild ED 22-25 No ED  PMH: Past Medical History:  Diagnosis Date  . Acid reflux   . Anxiety   . Arthritis     knees  . Colon polyp   . Depression   . Heart burn   . History of kidney stones   . Hypertension   . RLS (restless legs syndrome)   . Vertigo    (x1) several months ago    Surgical History: Past Surgical History:  Procedure Laterality Date  . COLONOSCOPY  2012   Dr. Tobias Alexander  . COLONOSCOPY WITH PROPOFOL N/A 10/06/2015   Procedure: COLONOSCOPY WITH PROPOFOL;  Surgeon: Lucilla Lame, MD;  Location: [place];  Service: Endoscopy;  Laterality: N/A;  . HERNIA REPAIR Right 02/01/15   inguinal   . POLYPECTOMY  10/06/2015   Procedure: POLYPECTOMY;  Surgeon: Lucilla Lame, MD;  Location: [place];  Service: Endoscopy;;  . UPPER GI ENDOSCOPY  2012    Home Medications:    Medication List       Accurate as of 08/14/16  3:58 PM. Always use your most recent med list.          aspirin EC 81 MG tablet Take 1 tablet (81 mg total) by mouth daily. (if taking ibuprofen, take the aspirin at least 1 hour before the ibuprofen)   atorvastatin 40 MG tablet Commonly known as:  LIPITOR Take 1 tablet (40 mg total) by mouth at bedtime.   clomiPHENE 50 MG tablet Commonly known as:  CLOMID Take 0.5 tablets (25 mg total) by mouth daily.   fluticasone 50 MCG/ACT nasal spray Commonly known as:  FLONASE Reported on 02/13/2016   ibuprofen 200 MG tablet Commonly known as:  ADVIL,MOTRIN Take 200 mg by mouth every 6 (six) hours as needed.   losartan 100 MG tablet Commonly known as:  COZAAR Take 100 mg by mouth daily.   omeprazole 20 MG capsule Commonly known as:  PRILOSEC Take 20 mg by mouth daily.   sertraline 25 MG tablet Commonly known as:  ZOLOFT Take 1 tablet (25 mg total) by mouth daily.   VITAMIN D-1000 MAX ST 1000 units tablet Generic drug:  Cholecalciferol Take by mouth.       Allergies: No Known Allergies  Family History: Family History  Problem Relation  Age of Onset  . COPD Mother   . Hypertension Mother   . Heart attack Father   . Heart disease  Father   . Hypertension Father   . Diabetes Maternal Uncle   . Cancer Maternal Grandmother     colon  . Kidney disease Neg Hx   . Prostate cancer Neg Hx     Social History:  reports that he has been smoking Cigarettes.  He has a 30.00 pack-year smoking history. He has never used smokeless tobacco. He reports that he does not drink alcohol or use drugs.  ROS: UROLOGY Frequent Urination?: No Hard to postpone urination?: No Burning/pain with urination?: No Get up at night to urinate?: No Leakage of urine?: No Urine stream starts and stops?: No Trouble starting stream?: No Do you have to strain to urinate?: No Blood in urine?: No Urinary tract infection?: No Sexually transmitted disease?: No Injury to kidneys or bladder?: No Painful intercourse?: No Weak stream?: No Erection problems?: No Penile pain?: No  Gastrointestinal Nausea?: No Vomiting?: No Indigestion/heartburn?: No Diarrhea?: No Constipation?: No  Constitutional Fever: No Night sweats?: No Weight loss?: No Fatigue?: No  Skin Skin rash/lesions?: No Itching?: No  Eyes Blurred vision?: No Double vision?: No  Ears/Nose/Throat Sore throat?: No Sinus problems?: No  Hematologic/Lymphatic Swollen glands?: No Easy bruising?: No  Cardiovascular Leg swelling?: No Chest pain?: No  Respiratory Cough?: No Shortness of breath?: No  Endocrine Excessive thirst?: No  Musculoskeletal Back pain?: No Joint pain?: No  Neurological Headaches?: No Dizziness?: No  Psychologic Depression?: No Anxiety?: No  Physical Exam: BP (!) 166/84   Pulse 62   Ht 6' (1.829 m)   Wt 187 lb 8 oz (85 kg)   BMI 25.43 kg/m   Constitutional: Well nourished. Alert and oriented, No acute distress. HEENT: [place] AT, moist mucus membranes. Trachea midline, no masses. Cardiovascular: No clubbing, cyanosis, or edema. Respiratory: Normal respiratory effort, no increased work of breathing. GI: Abdomen is soft, non tender,  non distended, no abdominal masses. Liver and spleen not palpable.  No hernias appreciated.  Stool sample for occult testing is not indicated.   GU: No CVA tenderness.  No bladder fullness or masses.  Patient with circumcised phallus.  Urethral meatus is patent.  No penile discharge. No penile lesions or rashes. Scrotum without lesions, cysts, rashes and/or edema.  Testicles are located scrotally bilaterally. No masses are appreciated in the testicles. Left and right epididymis are normal. Rectal: Patient with  normal sphincter tone. Anus and perineum without scarring or rashes. No rectal masses are appreciated. Prostate is approximately 60 grams, no nodules are appreciated. Seminal vesicles are normal. Skin: No rashes, bruises or suspicious lesions. Lymph: No cervical or inguinal adenopathy. Neurologic: Grossly intact, no focal deficits, moving all 4 extremities. Psychiatric: Normal mood and affect.  Laboratory Data: Lab Results  Component Value Date   WBC 8.7 04/26/2015   HGB 14.9 04/26/2015   HCT 43.1 08/13/2016   MCV 90.8 04/26/2015   PLT 316 04/26/2015    Lab Results  Component Value Date   CREATININE 1.05 07/06/2016    Lab Results  Component Value Date   HGBA1C 5.7 (H) 07/06/2016     Assessment & Plan:    1. Hypogonadism:     -most recent testosterone level is 861 ng/dL on 08/13/2016  -continue Clomid 50 mg, 1/2 tablet daily   -RTC in 6 months for HCT, testosterone, PSA, LFT's, ADAM and exam  2. BPH with LUTS  - IPSS  score is 2/0,  it is stable  - Continue conservative management, avoiding bladder irritants and timed voiding's  - RTC in 6 months for IPSS, PSA and exam, as testosterone therapy can cause prostate enlargement and worsen LUTS  3. Erectile dysfunction:     -SHIM score is 24  -RTC in 6 months for SHIM score and exam, as testosterone therapy can affect erections  4. Increase in PSA velocity  - Patient's PSA has been increasing over 0.75 ng/mL over the  last year  - discussed repeating the PSA at closer intervals vs biopsy   - Patient will RTC in 3 months for repeat PSA     Return in about 3 months (around 11/14/2016) for PSA only.  Zara Council, [facility] [address] [place], [place] [address] [phone]

## 2016-08-27 ENCOUNTER — Encounter: Payer: Self-pay | Admitting: Family Medicine

## 2016-08-27 ENCOUNTER — Ambulatory Visit (INDEPENDENT_AMBULATORY_CARE_PROVIDER_SITE_OTHER): Payer: BC Managed Care – PPO | Admitting: Family Medicine

## 2016-08-27 DIAGNOSIS — Z720 Tobacco use: Secondary | ICD-10-CM | POA: Diagnosis not present

## 2016-08-27 DIAGNOSIS — K219 Gastro-esophageal reflux disease without esophagitis: Secondary | ICD-10-CM

## 2016-08-27 DIAGNOSIS — E785 Hyperlipidemia, unspecified: Secondary | ICD-10-CM

## 2016-08-27 DIAGNOSIS — I10 Essential (primary) hypertension: Secondary | ICD-10-CM

## 2016-08-27 DIAGNOSIS — R7301 Impaired fasting glucose: Secondary | ICD-10-CM

## 2016-08-27 MED ORDER — RANITIDINE HCL 300 MG PO TABS: 300.0000 mg | ORAL_TABLET | Freq: Every day | ORAL | 11 refills | Status: DC

## 2016-08-27 NOTE — Patient Instructions (Addendum)
Try to limit saturated fats in your diet (bologna, hot dogs, barbeque, cheeseburgers, hamburgers, steak, bacon, sausage, cheese, etc.) and get more fresh fruits, vegetables, and whole grains  Caution: prolonged use of proton pump inhibitors like omeprazole (Prilosec), pantoprazole (Protonix), esomeprazole (Nexium), and others like Dexilant and Aciphex may increase your risk of pneumonia, Clostridium difficile colitis, osteoporosis, anemia and other health complications Try to limit or avoid triggers like coffee, caffeinated beverages, onions, chocolate, spicy foods, peppermint, acid foods like pizza, spaghetti sauce, and orange juice Lose weight if you are overweight or obese Try elevating the head of your bed by placing a small wedge between your mattress and box springs to keep acid in the stomach at night instead of coming up into your esophagus  Return for fasting cholesterol in about 3 months  I do encourage you to quit smoking Call [phone] to sign up for smoking cessation classes You can call 1-800-QUIT-NOW to talk with a smoking cessation coach     Cholesterol Cholesterol is a white, waxy, fat-like substance needed by your body in small amounts. The liver makes all the cholesterol you need. Cholesterol is carried from the liver by the blood through the blood vessels. Deposits of cholesterol (plaque) may build up on blood vessel walls. These make the arteries narrower and stiffer. Cholesterol plaques increase the risk for heart attack and stroke.  You cannot feel your cholesterol level even if it is very high. The only way to know it is high is with a blood test. Once you know your cholesterol levels, you should keep a record of the test results. Work with your health care provider to keep your levels in the desired range.  WHAT DO THE RESULTS MEAN?  Total cholesterol is a rough measure of all the cholesterol in your blood.   LDL is the so-called bad cholesterol. This is the type  that deposits cholesterol in the walls of the arteries. You want this level to be low.   HDL is the good cholesterol because it cleans the arteries and carries the LDL away. You want this level to be high.  Triglycerides are fat that the body can either burn for energy or store. High levels are closely linked to heart disease.  WHAT ARE THE DESIRED LEVELS OF CHOLESTEROL?  Total cholesterol below 200.   LDL below 100 for people at risk, below 70 for those at very high risk.   HDL above 50 is good, above 60 is best.   Triglycerides below 150.  HOW CAN I LOWER MY CHOLESTEROL?  Diet. Follow your diet programs as directed by your health care provider.   Choose fish or white meat chicken and [place], roasted or baked. Limit fatty cuts of red meat, fried foods, and processed meats, such as sausage and lunch meats.   Eat lots of fresh fruits and vegetables.  Choose whole grains, beans, pasta, potatoes, and cereals.   Use only small amounts of olive, corn, or canola oils.   Avoid butter, mayonnaise, shortening, or palm kernel oils.  Avoid foods with trans fats.   Drink skim or nonfat milk and eat low-fat or nonfat yogurt and cheeses. Avoid whole milk, cream, ice cream, egg yolks, and full-fat cheeses.   Healthy desserts include angel food cake, ginger snaps, animal crackers, hard candy, popsicles, and low-fat or nonfat frozen yogurt. Avoid pastries, cakes, pies, and cookies.   Exercise. Follow your exercise programs as directed by your health care provider.   A regular program helps decrease  LDL and raise HDL.   A regular program helps with weight control.   Do things that increase your activity level like gardening, walking, or taking the stairs. Ask your health care provider about how you can be more active in your daily life.   Medicine. Take medicine only as directed by your health care provider.   Medicine may be prescribed by your health care provider to help  lower cholesterol and decrease the risk for heart disease.   If you have several risk factors, you may need medicine even if your levels are normal.   This information is not intended to replace advice given to you by your health care provider. Make sure you discuss any questions you have with your health care provider.   Document Released: 07/10/2001 Document Revised: 11/05/2014 Document Reviewed: 07/29/2013 Elsevier Interactive Patient Education 2016 [employer]. Smoking Cessation, Tips for Success If you are ready to quit smoking, congratulations! You have chosen to help yourself be healthier. Cigarettes bring nicotine, tar, carbon monoxide, and other irritants into your body. Your lungs, heart, and blood vessels will be able to work better without these poisons. There are many different ways to quit smoking. Nicotine gum, nicotine patches, a nicotine inhaler, or nicotine nasal spray can help with physical craving. Hypnosis, support groups, and medicines help break the habit of smoking. WHAT THINGS CAN I DO TO MAKE QUITTING EASIER?  Here are some tips to help you quit for good:  Pick a date when you will quit smoking completely. Tell all of your friends and family about your plan to quit on that date.  Do not try to slowly cut down on the number of cigarettes you are smoking. Pick a quit date and quit smoking completely starting on that day.  Throw away all cigarettes.   Clean and remove all ashtrays from your home, work, and car.  On a card, write down your reasons for quitting. Carry the card with you and read it when you get the urge to smoke.  Cleanse your body of nicotine. Drink enough water and fluids to keep your urine clear or pale yellow. Do this after quitting to flush the nicotine from your body.  Learn to predict your moods. Do not let a bad situation be your excuse to have a cigarette. Some situations in your life might tempt you into wanting a cigarette.  Never have  "just one" cigarette. It leads to wanting another and another. Remind yourself of your decision to quit.  Change habits associated with smoking. If you smoked while driving or when feeling stressed, try other activities to replace smoking. Stand up when drinking your coffee. Brush your teeth after eating. Sit in a different chair when you read the paper. Avoid alcohol while trying to quit, and try to drink fewer caffeinated beverages. Alcohol and caffeine may urge you to smoke.  Avoid foods and drinks that can trigger a desire to smoke, such as sugary or spicy foods and alcohol.  Ask people who smoke not to smoke around you.  Have something planned to do right after eating or having a cup of coffee. For example, plan to take a walk or exercise.  Try a relaxation exercise to calm you down and decrease your stress. Remember, you may be tense and nervous for the first 2 weeks after you quit, but this will pass.  Find new activities to keep your hands busy. Play with a pen, coin, or rubber band. Doodle or draw things on  paper.  Brush your teeth right after eating. This will help cut down on the craving for the taste of tobacco after meals. You can also try mouthwash.   Use oral substitutes in place of cigarettes. Try using lemon drops, carrots, cinnamon sticks, or chewing gum. Keep them handy so they are available when you have the urge to smoke.  When you have the urge to smoke, try deep breathing.  Designate your home as a nonsmoking area.  If you are a heavy smoker, ask your health care provider about a prescription for nicotine chewing gum. It can ease your withdrawal from nicotine.  Reward yourself. Set aside the cigarette money you save and buy yourself something nice.  Look for support from others. Join a support group or smoking cessation program. Ask someone at home or at work to help you with your plan to quit smoking.  Always ask yourself, "Do I need this cigarette or is this just  a reflex?" Tell yourself, "Today, I choose not to smoke," or "I do not want to smoke." You are reminding yourself of your decision to quit.  Do not replace cigarette smoking with electronic cigarettes (commonly called e-cigarettes). The safety of e-cigarettes is unknown, and some may contain harmful chemicals.  If you relapse, do not give up! Plan ahead and think about what you will do the next time you get the urge to smoke. HOW WILL I FEEL WHEN I QUIT SMOKING? You may have symptoms of withdrawal because your body is used to nicotine (the addictive substance in cigarettes). You may crave cigarettes, be irritable, feel very hungry, cough often, get headaches, or have difficulty concentrating. The withdrawal symptoms are only temporary. They are strongest when you first quit but will go away within 10-14 days. When withdrawal symptoms occur, stay in control. Think about your reasons for quitting. Remind yourself that these are signs that your body is healing and getting used to being without cigarettes. Remember that withdrawal symptoms are easier to treat than the major diseases that smoking can cause.  Even after the withdrawal is over, expect periodic urges to smoke. However, these cravings are generally short lived and will go away whether you smoke or not. Do not smoke! WHAT RESOURCES ARE AVAILABLE TO HELP ME QUIT SMOKING? Your health care provider can direct you to community resources or hospitals for support, which may include:  Group support.  Education.  Hypnosis.  Therapy.   This information is not intended to replace advice given to you by your health care provider. Make sure you discuss any questions you have with your health care provider.   Document Released: 07/13/2004 Document Revised: 11/05/2014 Document Reviewed: 04/02/2013 Elsevier Interactive Patient Education [employer].

## 2016-08-27 NOTE — Progress Notes (Signed)
BP (!) 148/78 (BP Location: Left Arm, Patient Position: Sitting, Cuff Size: Large)   Pulse 74   Temp 98.2 F (36.8 C) (Oral)   Resp 16   Ht 6' (1.829 m)   Wt 190 lb 4.8 oz (86.3 kg)   SpO2 95%   BMI 25.81 kg/m    Subjective:    Patient ID: Philip Lynch, male    DOB: 08-09-1956, 60 y.o.   MRN: [id number]  HPI: Philip Lynch is a 60 y.o. male  Chief Complaint  Patient presents with  . Medication Refill    6 month F/U  . Hypertension  . Gastroesophageal Reflux  . Hyperlipidemia   High cholesterol; not taking medicine; he would like to start a diet; big hamburger steak right before having last labs drawn; trying to eat more chicken and fish and apples; honey nut cheerios or raisin bran with lowfat milk LDL crept up, reviewed Lab Results  Component Value Date   CHOL 236 (H) 07/06/2016   CHOL 204 (H) 12/27/2015   CHOL 221 (H) 09/05/2015   Lab Results  Component Value Date   HDL 44 07/06/2016   HDL 44 12/27/2015   HDL 53 09/05/2015   Lab Results  Component Value Date   LDLCALC 167 (H) 07/06/2016   LDLCALC 128 (H) 12/27/2015   LDLCALC 133 (H) 09/05/2015   Lab Results  Component Value Date   TRIG 125 07/06/2016   TRIG 158 (H) 12/27/2015   TRIG 173 (H) 09/05/2015   Lab Results  Component Value Date   CHOLHDL 4.6 12/27/2015   CHOLHDL 4.2 09/05/2015   No results found for: LDLDIRECT  GERD; controlled; had been on omeprazole; acidic foods trigger him, had EGD and colonoscopy by Dr. Jamal Collin  High blood pressure; fair control; not checking away from doctor's offices often; rarely takes decongestants  On  Ibuprofen just once in a while for back and knees; on concrete all day  Depression screen [facility] 2/9 08/27/2016 02/24/2016  Decreased Interest 0 0  Down, Depressed, Hopeless 0 0  PHQ - 2 Score 0 0   Relevant past medical, surgical, family and social history reviewed Past Medical History:  Diagnosis Date  . Acid reflux   . Anxiety   . Arthritis    knees  .  Colon polyp   . Depression   . Heart burn   . History of kidney stones   . Hypertension   . Impaired fasting glucose 05/02/2015  . RLS (restless legs syndrome)   . Vertigo    (x1) several months ago   Past Surgical History:  Procedure Laterality Date  . COLONOSCOPY  2012   Dr. Tobias Alexander  . COLONOSCOPY WITH PROPOFOL N/A 10/06/2015   Procedure: COLONOSCOPY WITH PROPOFOL;  Surgeon: Lucilla Lame, MD;  Location: [facility];  Service: Endoscopy;  Laterality: N/A;  . HERNIA REPAIR Right 02/01/15   inguinal   . POLYPECTOMY  10/06/2015   Procedure: POLYPECTOMY;  Surgeon: Lucilla Lame, MD;  Location: [facility];  Service: Endoscopy;;  . UPPER GI ENDOSCOPY  2012   Family History  Problem Relation Age of Onset  . COPD Mother   . Hypertension Mother   . Heart attack Father   . Heart disease Father   . Hypertension Father   . Diabetes Maternal Uncle   . Cancer Maternal Grandmother     colon  . Kidney disease Neg Hx   . Prostate cancer Neg Hx    Social History  Substance  Use Topics  . Smoking status: Current Every Day Smoker    Packs/day: 1.00    Years: 30.00    Types: Cigarettes    Start date: 08/27/1986  . Smokeless tobacco: Never Used  . Alcohol use No  MD note: he is thinking about quitting; not quite there yet, but down to 5 cigs some days, back up; he is thinking about it  Interim medical history since last visit reviewed. Allergies and medications reviewed  Review of Systems Per HPI unless specifically indicated above     Objective:    BP (!) 148/78 (BP Location: Left Arm, Patient Position: Sitting, Cuff Size: Large)   Pulse 74   Temp 98.2 F (36.8 C) (Oral)   Resp 16   Ht 6' (1.829 m)   Wt 190 lb 4.8 oz (86.3 kg)   SpO2 95%   BMI 25.81 kg/m   Wt Readings from Last 3 Encounters:  08/27/16 190 lb 4.8 oz (86.3 kg)  08/14/16 187 lb 8 oz (85 kg)  02/24/16 187 lb (84.8 kg)    Physical Exam  Constitutional: He appears well-developed and  well-nourished. No distress.  HENT:  Head: Normocephalic and atraumatic.  Eyes: EOM are normal. No scleral icterus.  Neck: No JVD present.  Cardiovascular: Normal rate and regular rhythm.   Pulmonary/Chest: Effort normal and breath sounds normal.  Abdominal: Soft. Bowel sounds are normal.  Neurological: He displays no tremor.  Skin: He is not diaphoretic. No pallor.  Psychiatric: His mood appears not anxious. He does not exhibit a depressed mood.    Results for orders placed or performed in visit on 08/13/16  Hematocrit  Result Value Ref Range   Hematocrit 43.1 37.5 - 51.0 %  Hepatic function panel  Result Value Ref Range   Total Protein 7.3 6.0 - 8.5 g/dL   Albumin 4.4 3.6 - 4.8 g/dL   Bilirubin Total 0.4 0.0 - 1.2 mg/dL   Bilirubin, Direct 0.11 0.00 - 0.40 mg/dL   Alkaline Phosphatase 92 39 - 117 IU/L   AST 19 0 - 40 IU/L   ALT 17 0 - 44 IU/L  Testosterone  Result Value Ref Range   Testosterone 861 264 - 916 ng/dL  PSA  Result Value Ref Range   Prostate Specific Ag, Serum 2.9 0.0 - 4.0 ng/mL      Assessment & Plan:   Problem List Items Addressed This Visit      Cardiovascular and Mediastinum   Essential hypertension, benign (Chronic)    DASH guidelines; unfortunately he smokes; now that he is 60 years old, recommended systolic goal is less than 150 mmHg; continue ARB; see AVS        Digestive   GERD without esophagitis (Chronic)    Discussed risks of PPI; will try H2 blocker; avoid triggers      Relevant Medications   ranitidine (ZANTAC) 300 MG tablet     Endocrine   Impaired fasting glucose (Chronic)    Last A1c had actually come down; watch diet        Other   Tobacco abuse (Chronic)    Patient is not ready to quit smoking; I am here to help if/when he is ready to quit      Dyslipidemia (Chronic)    Urged pt to cut out or at least limit foods with saturated fats in them; he wishes to recheck labs after changes in diet      Relevant Orders    Lipid panel  Other Visit Diagnoses   None.      Follow up plan: Return in about 3 months (around 11/27/2016) for fasting labs only; 6 months with Dr. Sanda Klein.  An after-visit summary was printed and given to the patient at [facility].  Please see the patient instructions which may contain other information and recommendations beyond what is mentioned above in the assessment and plan.  Meds ordered this encounter  Medications  . ranitidine (ZANTAC) 300 MG tablet    Sig: Take 1 tablet (300 mg total) by mouth at bedtime. To help with reflux and heartburn    Dispense:  30 tablet    Refill:  11    Orders Placed This Encounter  Procedures  . Lipid panel

## 2016-08-30 ENCOUNTER — Encounter: Payer: Self-pay | Admitting: Family Medicine

## 2016-08-30 NOTE — Assessment & Plan Note (Addendum)
DASH guidelines; unfortunately he smokes; now that he is 60 years old, recommended systolic goal is less than 150 mmHg; continue ARB; see AVS

## 2016-08-30 NOTE — Assessment & Plan Note (Signed)
Patient is not ready to quit smoking; I am here to help if/when he is ready to quit

## 2016-08-30 NOTE — Assessment & Plan Note (Signed)
Discussed risks of PPI; will try H2 blocker; avoid triggers

## 2016-08-30 NOTE — Assessment & Plan Note (Signed)
Last A1c had actually come down; watch diet

## 2016-08-30 NOTE — Assessment & Plan Note (Signed)
Urged pt to cut out or at least limit foods with saturated fats in them; he wishes to recheck labs after changes in diet

## 2016-09-14 ENCOUNTER — Other Ambulatory Visit: Payer: Self-pay | Admitting: Family Medicine

## 2016-09-14 NOTE — Telephone Encounter (Signed)
Patient just told me at last visit that he did NOT want to take statin I sent note back to pharmacy asking pt to call us This may have been an automatic refill (?)

## 2016-09-17 ENCOUNTER — Other Ambulatory Visit: Payer: Self-pay | Admitting: Family Medicine

## 2016-09-18 NOTE — Telephone Encounter (Signed)
Thank you That's what I thought Rx denied

## 2016-09-18 NOTE — Telephone Encounter (Signed)
Please contact patient He told me that he didn't want the statin This is the second request I've gotten Is he asking for this or is the pharmacy doing some automatic refill? Thank you

## 2016-09-18 NOTE — Telephone Encounter (Signed)
I spoke with Pt and he stated that he is doing very well without the statin. Pt would like to wait until after christmas to see if he needs the statin. I will call his pharmacy and speak with them.

## 2016-09-25 ENCOUNTER — Other Ambulatory Visit: Payer: Self-pay | Admitting: Family Medicine

## 2016-10-25 ENCOUNTER — Other Ambulatory Visit: Payer: Self-pay

## 2016-10-25 DIAGNOSIS — E291 Testicular hypofunction: Secondary | ICD-10-CM

## 2016-10-25 MED ORDER — CLOMIPHENE CITRATE 50 MG PO TABS: 25.0000 mg | ORAL_TABLET | Freq: Every day | ORAL | 3 refills | Status: DC

## 2016-11-14 ENCOUNTER — Other Ambulatory Visit: Payer: BC Managed Care – PPO

## 2016-11-20 ENCOUNTER — Other Ambulatory Visit: Payer: Self-pay

## 2016-11-20 DIAGNOSIS — N401 Enlarged prostate with lower urinary tract symptoms: Secondary | ICD-10-CM

## 2016-11-21 ENCOUNTER — Other Ambulatory Visit: Payer: BC Managed Care – PPO

## 2016-11-21 DIAGNOSIS — N401 Enlarged prostate with lower urinary tract symptoms: Secondary | ICD-10-CM

## 2016-11-22 ENCOUNTER — Telehealth: Payer: Self-pay

## 2016-11-22 LAB — PSA: PROSTATE SPECIFIC AG, SERUM: 2.4 ng/mL (ref 0.0–4.0)

## 2016-11-22 NOTE — Telephone Encounter (Signed)
-----   Message from Nori Riis, PA-C sent at 11/22/2016  8:09 AM EST ----- Please notify the patient that his PSA is stable.  I would like to see him in three months.  He will need morning testosterone (before 10 AM), LFT's, HCT, PSA to be drawn before appointment

## 2016-11-22 NOTE — Telephone Encounter (Signed)
LMOM

## 2016-11-22 NOTE — Telephone Encounter (Signed)
Pt called and I read message from [facility] to him and scheduled 3 month lab appt

## 2017-02-19 ENCOUNTER — Other Ambulatory Visit: Payer: Self-pay

## 2017-02-19 DIAGNOSIS — E291 Testicular hypofunction: Secondary | ICD-10-CM

## 2017-02-20 ENCOUNTER — Other Ambulatory Visit: Payer: BC Managed Care – PPO

## 2017-02-20 ENCOUNTER — Telehealth: Payer: Self-pay | Admitting: Urology

## 2017-02-20 DIAGNOSIS — E291 Testicular hypofunction: Secondary | ICD-10-CM

## 2017-02-20 MED ORDER — CLOMIPHENE CITRATE 50 MG PO TABS: 25.0000 mg | ORAL_TABLET | Freq: Every day | ORAL | 0 refills | Status: DC

## 2017-02-20 NOTE — Telephone Encounter (Signed)
Pt needs refill on Clomiphene 50 mg to [facility]

## 2017-02-20 NOTE — Telephone Encounter (Signed)
30 days with no refills were sent to pt pharmacy. Pt has an appt on 03/04/17 with Larene Beach.

## 2017-02-21 LAB — HEPATIC FUNCTION PANEL
ALBUMIN: 4.2 g/dL (ref 3.6–4.8)
ALK PHOS: 80 IU/L (ref 39–117)
ALT: 13 IU/L (ref 0–44)
AST: 16 IU/L (ref 0–40)
BILIRUBIN TOTAL: 0.3 mg/dL (ref 0.0–1.2)
BILIRUBIN, DIRECT: 0.07 mg/dL (ref 0.00–0.40)
Total Protein: 7 g/dL (ref 6.0–8.5)

## 2017-02-21 LAB — PSA: Prostate Specific Ag, Serum: 3.4 ng/mL (ref 0.0–4.0)

## 2017-02-21 LAB — HEMATOCRIT: Hematocrit: 44.1 % (ref 37.5–51.0)

## 2017-02-21 LAB — TESTOSTERONE: Testosterone: 1135 ng/dL — ABNORMAL HIGH (ref 264–916)

## 2017-02-22 ENCOUNTER — Ambulatory Visit (INDEPENDENT_AMBULATORY_CARE_PROVIDER_SITE_OTHER): Payer: BC Managed Care – PPO | Admitting: Family Medicine

## 2017-02-22 ENCOUNTER — Encounter: Payer: Self-pay | Admitting: Family Medicine

## 2017-02-22 DIAGNOSIS — Z5181 Encounter for therapeutic drug level monitoring: Secondary | ICD-10-CM | POA: Diagnosis not present

## 2017-02-22 DIAGNOSIS — K219 Gastro-esophageal reflux disease without esophagitis: Secondary | ICD-10-CM

## 2017-02-22 DIAGNOSIS — I10 Essential (primary) hypertension: Secondary | ICD-10-CM

## 2017-02-22 DIAGNOSIS — E291 Testicular hypofunction: Secondary | ICD-10-CM

## 2017-02-22 DIAGNOSIS — R7301 Impaired fasting glucose: Secondary | ICD-10-CM | POA: Diagnosis not present

## 2017-02-22 DIAGNOSIS — E785 Hyperlipidemia, unspecified: Secondary | ICD-10-CM

## 2017-02-22 DIAGNOSIS — Z720 Tobacco use: Secondary | ICD-10-CM

## 2017-02-22 NOTE — Assessment & Plan Note (Signed)
Check lipids 

## 2017-02-22 NOTE — Assessment & Plan Note (Signed)
Managed by urologist 

## 2017-02-22 NOTE — Assessment & Plan Note (Signed)
Check A1c and glucose 

## 2017-02-22 NOTE — Patient Instructions (Addendum)
Your goal blood pressure is less than 140 mmHg on top ideally, and under 150 at least Try to follow the DASH guidelines (DASH stands for Dietary Approaches to Stop Hypertension) Try to limit the sodium in your diet.  Ideally, consume less than 1.5 grams (less than 1,500mg ) per day. Do not add salt when cooking or at the table.  Check the sodium amount on labels when shopping, and choose items lower in sodium when given a choice. Avoid or limit foods that already contain a lot of sodium. Eat a diet rich in fruits and vegetables and whole grains.  Try to limit saturated fats in your diet (bologna, hot dogs, barbeque, cheeseburgers, hamburgers, steak, bacon, sausage, cheese, etc.) and get more fresh fruits, vegetables, and whole grains  I do encourage you to quit smoking Call [phone] to sign up for smoking cessation classes You can call 1-800-QUIT-NOW to talk with a smoking cessation coach

## 2017-02-22 NOTE — Assessment & Plan Note (Signed)
Trying to limit triggers; on H2 blocker; no red flags

## 2017-02-22 NOTE — Assessment & Plan Note (Signed)
Well=-controlled; continue to stay active, continue ARB; DASH guidelines

## 2017-02-22 NOTE — Progress Notes (Signed)
BP 136/74   Pulse 72   Temp 98.6 F (37 C) (Oral)   Resp 14   Wt 187 lb 8 oz (85 kg)   SpO2 95%   BMI 25.43 kg/m    Subjective:    Patient ID: Philip Lynch, male    DOB: July 13, 1956, 61 y.o.   MRN: [id number]  HPI: Philip Lynch is a 61 y.o. male  Chief Complaint  Patient presents with  . Follow-up    6 month   HPI High blood pressure Checks BP and in the 130s Getting lots of exercise, "I mean a lot"; taking trips to [place], clearing land, cutting trees, splitting wood, heavy exercise, makes him sore sometimes; no chest pain; following DASH guidelines  Low testosterone; on clomid and seeing urologist for that PSA did climb up from 2.4 to 3.4 over last 3 months; no trouble urinating  Acid reflux; going okay; new medicine took a while; not as good as omeprazole, but got used to it; gotten a lot better; tries to not eat late; [place] once a month; no bleeding and no abd pain; known hemorrhoids, had colonoscopy done last year  Taking apple cider vinegar one tbsp daily  High cholesterol; last LDL was 167 in Sept 2017; some weeks, no eggs at all; raisin bran or honey nut cheerios for breakfast; no bologna or hot dogs  Depression screen [facility] 2/9 02/22/2017 08/27/2016 02/24/2016  Decreased Interest 0 0 0  Down, Depressed, Hopeless 0 0 0  PHQ - 2 Score 0 0 0    Relevant past medical, surgical, family and social history reviewed Past Medical History:  Diagnosis Date  . Acid reflux   . Anxiety   . Arthritis    knees  . Colon polyp   . Depression   . Heart burn   . History of kidney stones   . Hypertension   . Impaired fasting glucose 05/02/2015  . RLS (restless legs syndrome)   . Vertigo    (x1) several months ago   Past Surgical History:  Procedure Laterality Date  . COLONOSCOPY  2012   Dr. Tobias Alexander  . COLONOSCOPY WITH PROPOFOL N/A 10/06/2015   Procedure: COLONOSCOPY WITH PROPOFOL;  Surgeon: Lucilla Lame, MD;  Location: [facility];  Service: Endoscopy;   Laterality: N/A;  . HERNIA REPAIR Right 02/01/15   inguinal   . POLYPECTOMY  10/06/2015   Procedure: POLYPECTOMY;  Surgeon: Lucilla Lame, MD;  Location: [facility];  Service: Endoscopy;;  . UPPER GI ENDOSCOPY  2012   Family History  Problem Relation Age of Onset  . COPD Mother   . Hypertension Mother   . Heart attack Father   . Heart disease Father   . Hypertension Father   . Diabetes Maternal Uncle   . Cancer Maternal Grandmother     colon  . Kidney disease Neg Hx   . Prostate cancer Neg Hx    Social History  Substance Use Topics  . Smoking status: Current Every Day Smoker    Packs/day: 1.00    Years: 30.00    Types: Cigarettes    Start date: 08/27/1986  . Smokeless tobacco: Never Used  . Alcohol use No    Interim medical history since last visit reviewed. Allergies and medications reviewed  Review of Systems Per HPI unless specifically indicated above     Objective:    BP 136/74   Pulse 72   Temp 98.6 F (37 C) (Oral)   Resp 14  Wt 187 lb 8 oz (85 kg)   SpO2 95%   BMI 25.43 kg/m   Wt Readings from Last 3 Encounters:  02/22/17 187 lb 8 oz (85 kg)  08/27/16 190 lb 4.8 oz (86.3 kg)  08/14/16 187 lb 8 oz (85 kg)    Physical Exam  Constitutional: He appears well-developed and well-nourished. No distress.  HENT:  Head: Normocephalic and atraumatic.  Eyes: EOM are normal. No scleral icterus.  Neck: No JVD present.  Cardiovascular: Normal rate and regular rhythm.   Pulmonary/Chest: Effort normal and breath sounds normal.  Abdominal: Soft. Bowel sounds are normal. He exhibits no distension.  Musculoskeletal: He exhibits no edema.  Neurological: He displays no tremor.  Skin: He is not diaphoretic. No pallor.  Psychiatric: His mood appears not anxious. He does not exhibit a depressed mood.   Results for orders placed or performed in visit on 02/20/17  PSA  Result Value Ref Range   Prostate Specific Ag, Serum 3.4 0.0 - 4.0 ng/mL  Testosterone    Result Value Ref Range   Testosterone 1,135 (H) 264 - 916 ng/dL  Hepatic function panel  Result Value Ref Range   Total Protein 7.0 6.0 - 8.5 g/dL   Albumin 4.2 3.6 - 4.8 g/dL   Bilirubin Total 0.3 0.0 - 1.2 mg/dL   Bilirubin, Direct 0.07 0.00 - 0.40 mg/dL   Alkaline Phosphatase 80 39 - 117 IU/L   AST 16 0 - 40 IU/L   ALT 13 0 - 44 IU/L  Hematocrit  Result Value Ref Range   Hematocrit 44.1 37.5 - 51.0 %      Assessment & Plan:   Problem List Items Addressed This Visit      Cardiovascular and Mediastinum   Essential hypertension, benign (Chronic)    Well=-controlled; continue to stay active, continue ARB; DASH guidelines         Digestive   GERD without esophagitis (Chronic)    Trying to limit triggers; on H2 blocker; no red flags        Endocrine   Impaired fasting glucose (Chronic)    Check A1c and glucose      Relevant Orders   Hemoglobin A1c   Hypogonadism in male    Managed by urologist        Other   Tobacco abuse (Chronic)    Still smoking; "I need to quit" he says; I am here to help if/when ready      Medication monitoring encounter   Relevant Orders   Basic Metabolic Panel (BMET)   Dyslipidemia (Chronic)    Check lipids      Relevant Orders   Lipid panel       Follow up plan: No Follow-up on file.  An after-visit summary was printed and given to the patient at [facility].  Please see the patient instructions which may contain other information and recommendations beyond what is mentioned above in the assessment and plan.  No orders of the defined types were placed in this encounter.   Orders Placed This Encounter  Procedures  . Basic Metabolic Panel (BMET)  . Hemoglobin A1c  . Lipid panel

## 2017-02-22 NOTE — Assessment & Plan Note (Signed)
Still smoking; "I need to quit" he says; I am here to help if/when ready

## 2017-03-01 NOTE — Progress Notes (Signed)
3:14 PM   Philip Lynch [birth year] [id number]  Referring provider: Arnetha Courser, MD [address] [place],  [address]  Chief Complaint  Patient presents with  . 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   Testosterone deficiency Patient is experiencing a recent deterioration in an ability to play sports.  This is indicated by his responses to the ADAM questionnaire. He is no longer having spontaneous erections at night.   He does not have sleep apnea.   His current testosterone level is 1,135 ng/dL on 02/20/2017.  Hematocrit and liver function tests are normal.   He is currently managing his hypogonadism with Clomid 50 mg, 1/2 tablet daily.       Androgen Deficiency in the Aging Male    [place] Name 03/04/17 1400         Androgen Deficiency in the Aging Male   Do you have a decrease in libido (sex drive) No     Do you have lack of energy No     Do you have a decrease in strength and/or endurance No     Have you lost height No     Have you noticed a decreased "enjoyment of life" No     Are you sad and/or grumpy No     Are your erections less strong No     Have you noticed a recent deterioration in your ability to play sports Yes     Are you falling asleep after dinner No     Has there been a recent deterioration in your work performance No       BPH WITH LUTS His IPSS score today is 2, which is mild lower urinary tract symptomatology. He is delighted with his quality life due to his urinary symptoms.  His previous IPSS was 2/0.  His major complaints are nocturia x 1.  He denies any dysuria, hematuria or suprapubic pain.  He also denies any recent fevers, chills, nausea or vomiting.  He does not have a family history of PCa.     IPSS    Row Name 03/04/17 1400         International Prostate Symptom Score   How often  have you had the sensation of not emptying your bladder? Not at All     How often have you had to urinate less than every two hours? Less than 1 in 5 times     How often have you found you stopped and started again several times when you urinated? Not at All     How often have you found it difficult to postpone urination? Not at All     How often have you had a weak urinary stream? Not at All     How often have you had to strain to start urination? Not at All     How many times did you typically get up at night to urinate? 1 Time     Total IPSS Score 2       Quality of Life due to urinary symptoms   If you were to spend the rest of your life with your urinary condition just the way it is now how would you feel about that? Delighted        Score:  1-7 Mild 8-19 Moderate 20-35 Severe  Erectile dysfunction His SHIM score is 23, which is no erectile dysfunction.  His previous SHIM score was 22.   He does complain of some situations where his erections are not firm enough for penetration, but it is not impacting his sex life at this time.     SHIM    Row Name 03/04/17 1444         SHIM: Over the last 6 months:   How do you rate your confidence that you could get and keep an erection? High     When you had erections with sexual stimulation, how often were your erections hard enough for penetration (entering your partner)? Most Times (much more than half the time)     During sexual intercourse, how often were you able to maintain your erection after you had penetrated (entered) your partner? Almost Always or Always     During sexual intercourse, how difficult was it to maintain your erection to completion of intercourse? Not Difficult     When you attempted sexual intercourse, how often was it satisfactory for you? Almost Always or Always       SHIM Total Score   SHIM 23        Score: 1-7 Severe ED 8-11 Moderate ED 12-16 Mild-Moderate ED 17-21 Mild ED 22-25 No ED  PMH: Past  Medical History:  Diagnosis Date  . Acid reflux   . Anxiety   . Arthritis    knees  . Colon polyp   . Depression   . Heart burn   . History of kidney stones   . Hypertension   . Impaired fasting glucose 05/02/2015  . RLS (restless legs syndrome)   . Vertigo    (x1) several months ago    Surgical History: Past Surgical History:  Procedure Laterality Date  . COLONOSCOPY  2012   Dr. Tobias Alexander  . COLONOSCOPY WITH PROPOFOL N/A 10/06/2015   Procedure: COLONOSCOPY WITH PROPOFOL;  Surgeon: Lucilla Lame, MD;  Location: [facility];  Service: Endoscopy;  Laterality: N/A;  . HERNIA REPAIR Right 02/01/15   inguinal   . POLYPECTOMY  10/06/2015   Procedure: POLYPECTOMY;  Surgeon: Lucilla Lame, MD;  Location: [facility];  Service: Endoscopy;;  . UPPER GI ENDOSCOPY  2012    Home Medications:  Allergies as of 03/04/2017   No Known Allergies     Medication List       Accurate as of 03/04/17  3:14 PM. Always use your most recent med list.          aspirin EC 81 MG tablet Take 1 tablet (81 mg total) by mouth daily. (if taking ibuprofen, take the aspirin at least 1 hour before the ibuprofen)   clomiPHENE 50 MG tablet Commonly known as:  CLOMID Take 1/2 tablet every other day   fluticasone 50 MCG/ACT nasal spray Commonly known as:  FLONASE Reported on 02/13/2016   ibuprofen 200 MG tablet Commonly known as:  ADVIL,MOTRIN Take 200 mg by mouth every 6 (six) hours as needed.   losartan 100 MG tablet Commonly known as:  COZAAR Take 100 mg by mouth daily.   ranitidine 300 MG tablet Commonly known as:  ZANTAC Take 1 tablet (300 mg total) by mouth at bedtime. To help with reflux and heartburn   sertraline 25 MG tablet Commonly known as:  ZOLOFT TAKE 1 TABLET (25 MG TOTAL) BY MOUTH DAILY.   VITAMIN D-1000 MAX ST 1000 units tablet Generic drug:  Cholecalciferol Take by mouth.  Allergies: No Known Allergies  Family History: Family History  Problem Relation  Age of Onset  . COPD Mother   . Hypertension Mother   . Heart attack Father   . Heart disease Father   . Hypertension Father   . Diabetes Maternal Uncle   . Cancer Maternal Grandmother     colon  . Kidney disease Neg Hx   . Prostate cancer Neg Hx     Social History:  reports that he has been smoking Cigarettes.  He started smoking about 30 years ago. He has a 30.00 pack-year smoking history. He has never used smokeless tobacco. He reports that he does not drink alcohol or use drugs.  ROS: UROLOGY Frequent Urination?: No Hard to postpone urination?: No Burning/pain with urination?: No Get up at night to urinate?: Yes Leakage of urine?: No Urine stream starts and stops?: No Trouble starting stream?: No Do you have to strain to urinate?: No Blood in urine?: No Urinary tract infection?: No Sexually transmitted disease?: No Injury to kidneys or bladder?: No Painful intercourse?: No Weak stream?: No Erection problems?: No Penile pain?: No  Gastrointestinal Nausea?: No Vomiting?: No Indigestion/heartburn?: No Diarrhea?: No Constipation?: No  Constitutional Fever: No Night sweats?: No Weight loss?: No Fatigue?: No  Skin Skin rash/lesions?: No Itching?: No  Eyes Blurred vision?: No Double vision?: No  Ears/Nose/Throat Sore throat?: No Sinus problems?: No  Hematologic/Lymphatic Swollen glands?: No Easy bruising?: No  Cardiovascular Leg swelling?: No Chest pain?: No  Respiratory Cough?: No Shortness of breath?: No  Endocrine Excessive thirst?: No  Musculoskeletal Back pain?: No Joint pain?: No  Neurological Headaches?: No Dizziness?: No  Psychologic Depression?: No Anxiety?: No  Physical Exam: BP (!) 179/88   Pulse 71   Ht 6' (1.829 m)   Wt 188 lb (85.3 kg)   BMI 25.50 kg/m   Constitutional: Well nourished. Alert and oriented, No acute distress. HEENT: [place] AT, moist mucus membranes. Trachea midline, no masses. Cardiovascular: No  clubbing, cyanosis, or edema. Respiratory: Normal respiratory effort, no increased work of breathing. GI: Abdomen is soft, non tender, non distended, no abdominal masses. Liver and spleen not palpable.  No hernias appreciated.  Stool sample for occult testing is not indicated.   GU: No CVA tenderness.  No bladder fullness or masses.  Patient with circumcised phallus.  Urethral meatus is patent.  No penile discharge. No penile lesions or rashes. Scrotum without lesions, cysts, rashes and/or edema.  Testicles are located scrotally bilaterally. No masses are appreciated in the testicles. Left and right epididymis are normal. Rectal: Patient with  normal sphincter tone. Anus and perineum without scarring or rashes. No rectal masses are appreciated. Prostate is approximately 60 grams, no nodules are appreciated. Seminal vesicles are normal. Skin: No rashes, bruises or suspicious lesions. Lymph: No cervical or inguinal adenopathy. Neurologic: Grossly intact, no focal deficits, moving all 4 extremities. Psychiatric: Normal mood and affect.  Laboratory Data: PSA history  2.1 ng/mL on 09/05/2015  2.6 ng/mL on 02/13/2016  2.9 ng/mL on 08/23/2016  2.4 ng/mL on 11/21/2016  3.4 ng/mL on 02/20/2017   Lab Results  Component Value Date   WBC 8.7 04/26/2015   HGB 14.9 04/26/2015   HCT 44.1 02/20/2017   MCV 90.8 04/26/2015   PLT 316 04/26/2015    Lab Results  Component Value Date   CREATININE 1.05 07/06/2016    Lab Results  Component Value Date   HGBA1C 5.7 (H) 07/06/2016     Assessment & Plan:  1. Testosterone deficiency     -most recent testosterone level is 1,135 ng/dL on 02/20/2017  -reduce Clomid 50 mg, 1/2 tablet daily every other day  -RTC in 6 months for HCT, testosterone, PSA, LFT's, ADAM and exam  2. BPH with LUTS  - IPSS score is 2/0,  it is stable  - Continue conservative management, avoiding bladder irritants and timed voiding's  - RTC in 6 months for IPSS, PSA and exam,  as testosterone therapy can cause prostate enlargement and worsen LUTS  3. Erectile dysfunction:     -SHIM score is 23  -RTC in 6 months for SHIM score and exam, as testosterone therapy can affect erections  4. Increase in PSA velocity  - Patient's PSA has been increasing over 0.75 ng/mL over the last year  - discussed repeating the PSA at closer intervals vs biopsy   - Repeat PSA today - RTC PSA results     Return for pending PSA results.  Zara Council, [facility] [address] [place], [place] [address] [phone]

## 2017-03-04 ENCOUNTER — Encounter: Payer: Self-pay | Admitting: Urology

## 2017-03-04 ENCOUNTER — Ambulatory Visit (INDEPENDENT_AMBULATORY_CARE_PROVIDER_SITE_OTHER): Payer: BC Managed Care – PPO | Admitting: Urology

## 2017-03-04 VITALS — BP 179/88 | HR 71 | Ht 72.0 in | Wt 188.0 lb

## 2017-03-04 DIAGNOSIS — R972 Elevated prostate specific antigen [PSA]: Secondary | ICD-10-CM | POA: Diagnosis not present

## 2017-03-04 DIAGNOSIS — E349 Endocrine disorder, unspecified: Secondary | ICD-10-CM | POA: Diagnosis not present

## 2017-03-04 DIAGNOSIS — E291 Testicular hypofunction: Secondary | ICD-10-CM | POA: Diagnosis not present

## 2017-03-04 DIAGNOSIS — F5221 Male erectile disorder: Secondary | ICD-10-CM | POA: Diagnosis not present

## 2017-03-04 DIAGNOSIS — N400 Enlarged prostate without lower urinary tract symptoms: Secondary | ICD-10-CM

## 2017-03-04 MED ORDER — CLOMIPHENE CITRATE 50 MG PO TABS: ORAL_TABLET | ORAL | 0 refills | Status: DC

## 2017-03-05 ENCOUNTER — Telehealth: Payer: Self-pay

## 2017-03-05 DIAGNOSIS — E291 Testicular hypofunction: Secondary | ICD-10-CM

## 2017-03-05 LAB — PSA: Prostate Specific Ag, Serum: 3 ng/mL (ref 0.0–4.0)

## 2017-03-05 NOTE — Telephone Encounter (Signed)
Spoke with pt in reference to PSA results. Made aware will need testosterone and PSA again in 55mo. Pt voiced understanding. Lab appt made and orders placed.

## 2017-03-05 NOTE — Telephone Encounter (Signed)
LMOM

## 2017-03-05 NOTE — Telephone Encounter (Signed)
-----   Message from Nori Riis, PA-C sent at 03/05/2017  7:57 AM EDT ----- Please let the patient know that his PSA has come down.  I would like to repeat a morning testosterone and PSA in one month.

## 2017-03-24 ENCOUNTER — Other Ambulatory Visit: Payer: Self-pay | Admitting: Family Medicine

## 2017-03-27 ENCOUNTER — Telehealth: Payer: Self-pay | Admitting: Family Medicine

## 2017-03-27 NOTE — Telephone Encounter (Signed)
Please ask patient to please get the labs done that were ordered in April; thank you

## 2017-03-28 NOTE — Telephone Encounter (Signed)
Left detailed messaged

## 2017-04-04 ENCOUNTER — Other Ambulatory Visit: Payer: Self-pay | Admitting: Family Medicine

## 2017-04-04 LAB — LIPID PANEL
CHOL/HDL RATIO: 5.3 ratio — AB (ref <5.0)
CHOLESTEROL: 219 mg/dL — AB (ref <200)
HDL: 41 mg/dL (ref >40)
LDL Cholesterol: 158 mg/dL — ABNORMAL HIGH (ref <100)
TRIGLYCERIDES: 98 mg/dL (ref <150)
VLDL: 20 mg/dL (ref <30)

## 2017-04-04 LAB — BASIC METABOLIC PANEL
BUN: 17 mg/dL (ref 7–25)
CO2: 27 mmol/L (ref 20–31)
Calcium: 9.4 mg/dL (ref 8.6–10.3)
Chloride: 105 mmol/L (ref 98–110)
Creat: 1.04 mg/dL (ref 0.70–1.25)
Glucose, Bld: 101 mg/dL — ABNORMAL HIGH (ref 65–99)
POTASSIUM: 5.2 mmol/L (ref 3.5–5.3)
SODIUM: 137 mmol/L (ref 135–146)

## 2017-04-05 LAB — HEMOGLOBIN A1C
HEMOGLOBIN A1C: 5.5 % (ref <5.7)
MEAN PLASMA GLUCOSE: 111 mg/dL

## 2017-04-08 ENCOUNTER — Other Ambulatory Visit: Payer: BC Managed Care – PPO

## 2017-04-08 DIAGNOSIS — E291 Testicular hypofunction: Secondary | ICD-10-CM

## 2017-04-09 LAB — PSA: PROSTATE SPECIFIC AG, SERUM: 2.9 ng/mL (ref 0.0–4.0)

## 2017-04-09 LAB — TESTOSTERONE: Testosterone: 904 ng/dL (ref 264–916)

## 2017-04-15 ENCOUNTER — Telehealth: Payer: Self-pay

## 2017-04-15 DIAGNOSIS — N400 Enlarged prostate without lower urinary tract symptoms: Secondary | ICD-10-CM

## 2017-04-15 DIAGNOSIS — E291 Testicular hypofunction: Secondary | ICD-10-CM

## 2017-04-15 NOTE — Telephone Encounter (Signed)
-----   Message from Nori Riis, PA-C sent at 04/15/2017  8:10 AM EDT ----- Please let Mr. Cerasoli know that his testosterone is normal and his PSA has come down.  I would to see him in 6 months.  He will need LFT's, PSA, a morning testosterone and HCT/HBG prior to his visit.

## 2017-04-15 NOTE — Telephone Encounter (Signed)
Patient notified of results please schedule for 6 month w/[facility] and labs a week prior AM draw, orders placed

## 2017-04-15 NOTE — Telephone Encounter (Signed)
-----   Message from Nori Riis, PA-C sent at 04/15/2017  8:10 AM EDT ----- Please let Philip Lynch know that his testosterone is normal and his PSA has come down.  I would to see him in 6 months.  He will need LFT's, PSA, a morning testosterone and HCT/HBG prior to his visit.

## 2017-04-22 ENCOUNTER — Telehealth: Payer: Self-pay | Admitting: Urology

## 2017-04-22 NOTE — Telephone Encounter (Signed)
Apps made and mailed to the patient  Philip Lynch

## 2017-04-29 ENCOUNTER — Other Ambulatory Visit: Payer: Self-pay

## 2017-04-29 DIAGNOSIS — I10 Essential (primary) hypertension: Secondary | ICD-10-CM

## 2017-04-29 MED ORDER — LOSARTAN POTASSIUM 100 MG PO TABS: 100.0000 mg | ORAL_TABLET | Freq: Every day | ORAL | 0 refills | Status: DC

## 2017-04-29 NOTE — Telephone Encounter (Signed)
We will provide 30 day refill, needs to schedule an appointment.

## 2017-04-29 NOTE — Telephone Encounter (Signed)
Pt needs refill states has been on this but was getting from old PCP?

## 2017-04-30 MED ORDER — LOSARTAN POTASSIUM 100 MG PO TABS: 100.0000 mg | ORAL_TABLET | Freq: Every day | ORAL | 5 refills | Status: DC

## 2017-04-30 NOTE — Telephone Encounter (Signed)
I'm happy to refill this without an additional appointment I'm not sure why covering colleague wanted him to be seen Sorry for any confusion

## 2017-04-30 NOTE — Telephone Encounter (Signed)
Patient was confused why he needed to f/u according to last note on 4/27 you told him to f/u in 6 months? Please advise and give further refills

## 2017-08-26 ENCOUNTER — Other Ambulatory Visit: Payer: Self-pay | Admitting: Family Medicine

## 2017-09-28 ENCOUNTER — Other Ambulatory Visit: Payer: Self-pay | Admitting: Family Medicine

## 2017-10-08 ENCOUNTER — Other Ambulatory Visit: Payer: Self-pay | Admitting: Urology

## 2017-10-08 DIAGNOSIS — E291 Testicular hypofunction: Secondary | ICD-10-CM

## 2017-10-14 ENCOUNTER — Other Ambulatory Visit: Payer: BC Managed Care – PPO

## 2017-10-17 ENCOUNTER — Ambulatory Visit: Payer: BC Managed Care – PPO | Admitting: Urology

## 2017-11-04 ENCOUNTER — Other Ambulatory Visit: Payer: BC Managed Care – PPO

## 2017-11-04 DIAGNOSIS — N400 Enlarged prostate without lower urinary tract symptoms: Secondary | ICD-10-CM

## 2017-11-04 DIAGNOSIS — E291 Testicular hypofunction: Secondary | ICD-10-CM

## 2017-11-07 ENCOUNTER — Ambulatory Visit: Payer: BC Managed Care – PPO | Admitting: Urology

## 2017-11-11 ENCOUNTER — Other Ambulatory Visit: Payer: BC Managed Care – PPO

## 2017-11-14 ENCOUNTER — Ambulatory Visit: Payer: BC Managed Care – PPO | Admitting: Urology

## 2017-11-27 ENCOUNTER — Other Ambulatory Visit: Payer: Self-pay | Admitting: Family Medicine

## 2017-11-27 DIAGNOSIS — I10 Essential (primary) hypertension: Secondary | ICD-10-CM

## 2017-11-27 NOTE — Telephone Encounter (Signed)
Please ask pt to schedule a visit, and we'll get labs when he comes too I'll refill his medicine We look forward to seeing him soon

## 2017-12-04 ENCOUNTER — Other Ambulatory Visit: Payer: Self-pay

## 2017-12-04 DIAGNOSIS — E291 Testicular hypofunction: Secondary | ICD-10-CM

## 2017-12-05 ENCOUNTER — Other Ambulatory Visit: Payer: BC Managed Care – PPO

## 2017-12-05 ENCOUNTER — Other Ambulatory Visit: Payer: Self-pay

## 2017-12-05 DIAGNOSIS — E291 Testicular hypofunction: Secondary | ICD-10-CM

## 2017-12-06 LAB — HEMOGLOBIN: HEMOGLOBIN: 15 g/dL (ref 13.0–17.7)

## 2017-12-06 LAB — HEPATIC FUNCTION PANEL
ALT: 13 IU/L (ref 0–44)
AST: 16 IU/L (ref 0–40)
Albumin: 4.2 g/dL (ref 3.6–4.8)
Alkaline Phosphatase: 86 IU/L (ref 39–117)
BILIRUBIN TOTAL: 0.3 mg/dL (ref 0.0–1.2)
BILIRUBIN, DIRECT: 0.08 mg/dL (ref 0.00–0.40)
Total Protein: 6.9 g/dL (ref 6.0–8.5)

## 2017-12-06 LAB — PSA: Prostate Specific Ag, Serum: 3.4 ng/mL (ref 0.0–4.0)

## 2017-12-06 LAB — TESTOSTERONE: TESTOSTERONE: 868 ng/dL (ref 264–916)

## 2017-12-06 LAB — HEMATOCRIT: Hematocrit: 44.4 % (ref 37.5–51.0)

## 2017-12-12 ENCOUNTER — Ambulatory Visit: Payer: BC Managed Care – PPO | Admitting: Urology

## 2017-12-12 ENCOUNTER — Encounter: Payer: Self-pay | Admitting: Urology

## 2017-12-12 VITALS — BP 162/84 | HR 73 | Ht 72.0 in | Wt 187.0 lb

## 2017-12-12 DIAGNOSIS — E349 Endocrine disorder, unspecified: Secondary | ICD-10-CM

## 2017-12-12 DIAGNOSIS — N400 Enlarged prostate without lower urinary tract symptoms: Secondary | ICD-10-CM

## 2017-12-12 DIAGNOSIS — R972 Elevated prostate specific antigen [PSA]: Secondary | ICD-10-CM | POA: Diagnosis not present

## 2017-12-12 NOTE — Progress Notes (Signed)
2:16 PM   Philip Lynch Sep 24, 1956 [id number]  Referring provider: Arnetha Courser, MD [address] [place], [place] [address]  Chief Complaint  Patient presents with  . 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    Testosterone deficiency He is no longer having spontaneous erections at night.   He does not have sleep apnea.   His current testosterone level is 868 ng/dL on 12/05/2017.  Hematocrit and liver function tests are normal.   He is currently managing his hypogonadism with Clomid 50 mg, 1/2 tablet daily.    BPH WITH LUTS His IPSS score today is 2, which is mild lower urinary tract symptomatology. He is delighted with his quality life due to his urinary symptoms.  His previous IPSS was 2/0.  His major complaints are nocturia x 1.  He denies any dysuria, hematuria or suprapubic pain.  He also denies any recent fevers, chills, nausea or vomiting.  He does not have a family history of PCa. IPSS    Row Name 12/12/17 1300         International Prostate Symptom Score   How often have you had the sensation of not emptying your bladder?  Not at All     How often have you had to urinate less than every two hours?  Less than 1 in 5 times     How often have you found you stopped and started again several times when you urinated?  Not at All     How often have you found it difficult to postpone urination?  Not at All     How often have you had a weak urinary stream?  Not at All     How often have you had to strain to start urination?  Not at All     How many times did you typically get up at night to urinate?  1 Time     Total IPSS Score  2       Quality of Life due to urinary symptoms   If you were to spend the rest of your life with your urinary condition just the way it is now how would you feel about that?  Delighted        Score:  1-7 Mild 8-19  Moderate 20-35 Severe  Erectile dysfunction His SHIM score is 23, which is no erectile dysfunction.  His previous SHIM score was 23.   He does complain of some situations where his erections are not firm enough for penetration, but it is not impacting his sex life at this time. SHIM    Row Name 12/12/17 1402         SHIM: Over the last 6 months:   How do you rate your confidence that you could get and keep an erection?  High     When you had erections with sexual stimulation, how often were your erections hard enough for penetration (entering your partner)?  Almost Always or Always     During sexual intercourse, how often were you able to maintain your erection after you had penetrated (entered) your partner?  Most Times (much more than half the time)     During sexual intercourse, how difficult was it to maintain your erection to completion of intercourse?  Not Difficult     When you attempted sexual intercourse, how often was  it satisfactory for you?  Almost Always or Always       SHIM Total Score   SHIM  23        Score: 1-7 Severe ED 8-11 Moderate ED 12-16 Mild-Moderate ED 17-21 Mild ED 22-25 No ED  PMH: Past Medical History:  Diagnosis Date  . Acid reflux   . Anxiety   . Arthritis    knees  . Colon polyp   . Depression   . Heart burn   . History of kidney stones   . Hypertension   . Impaired fasting glucose 05/02/2015  . RLS (restless legs syndrome)   . Vertigo    (x1) several months ago    Surgical History: Past Surgical History:  Procedure Laterality Date  . COLONOSCOPY  2012   Dr. Tobias Alexander  . COLONOSCOPY WITH PROPOFOL N/A 10/06/2015   Procedure: COLONOSCOPY WITH PROPOFOL;  Surgeon: Lucilla Lame, MD;  Location: [facility];  Service: Endoscopy;  Laterality: N/A;  . HERNIA REPAIR Right 02/01/15   inguinal   . POLYPECTOMY  10/06/2015   Procedure: POLYPECTOMY;  Surgeon: Lucilla Lame, MD;  Location: [facility];  Service: Endoscopy;;  . UPPER GI  ENDOSCOPY  2012    Home Medications:  Allergies as of 12/12/2017   No Known Allergies     Medication List        Accurate as of 12/12/17  2:16 PM. Always use your most recent med list.          aspirin EC 81 MG tablet Take 1 tablet (81 mg total) by mouth daily. (if taking ibuprofen, take the aspirin at least 1 hour before the ibuprofen)   clomiPHENE 50 MG tablet Commonly known as:  CLOMID TAKE ONE-HALF TABLET BY MOUTH EVERY OTHER DAY   fluticasone 50 MCG/ACT nasal spray Commonly known as:  FLONASE Reported on 02/13/2016   ibuprofen 200 MG tablet Commonly known as:  ADVIL,MOTRIN Take 200 mg by mouth every 6 (six) hours as needed.   losartan 100 MG tablet Commonly known as:  COZAAR TAKE 1 TABLET BY MOUTH EVERY DAY   ranitidine 300 MG tablet Commonly known as:  ZANTAC TAKE 1 TABLET (300 MG TOTAL) BY MOUTH AT BEDTIME. TO HELP WITH REFLUX AND HEARTBURN   sertraline 25 MG tablet Commonly known as:  ZOLOFT TAKE 1 TABLET (25 MG TOTAL) BY MOUTH DAILY.   VITAMIN D-1000 MAX ST 1000 units tablet Generic drug:  Cholecalciferol Take by mouth.       Allergies: No Known Allergies  Family History: Family History  Problem Relation Age of Onset  . COPD Mother   . Hypertension Mother   . Heart attack Father   . Heart disease Father   . Hypertension Father   . Diabetes Maternal Uncle   . Cancer Maternal Grandmother        colon  . Kidney disease Neg Hx   . Prostate cancer Neg Hx     Social History:  reports that he has been smoking cigarettes.  He started smoking about 31 years ago. He has a 30.00 pack-year smoking history. he has never used smokeless tobacco. He reports that he does not drink alcohol or use drugs.  ROS: UROLOGY Frequent Urination?: No Hard to postpone urination?: No Burning/pain with urination?: No Get up at night to urinate?: Yes Leakage of urine?: No Urine stream starts and stops?: No Trouble starting stream?: No Do you have to strain to  urinate?: No Blood in urine?: No Urinary tract  infection?: No Sexually transmitted disease?: No Injury to kidneys or bladder?: No Painful intercourse?: No Weak stream?: No Erection problems?: No Penile pain?: No  Gastrointestinal Nausea?: No Vomiting?: No Indigestion/heartburn?: No Diarrhea?: No Constipation?: No  Constitutional Fever: No Night sweats?: No Weight loss?: No Fatigue?: No  Skin Skin rash/lesions?: No Itching?: No  Eyes Blurred vision?: No Double vision?: No  Ears/Nose/Throat Sore throat?: No Sinus problems?: No  Hematologic/Lymphatic Swollen glands?: No Easy bruising?: No  Cardiovascular Leg swelling?: No Chest pain?: No  Respiratory Cough?: No Shortness of breath?: No  Endocrine Excessive thirst?: No  Musculoskeletal Back pain?: Yes Joint pain?: No  Neurological Headaches?: No Dizziness?: No  Psychologic Depression?: No Anxiety?: No  Physical Exam: BP (!) 162/84   Pulse 73   Ht 6' (1.829 m)   Wt 187 lb (84.8 kg)   BMI 25.36 kg/m   Constitutional: Well nourished. Alert and oriented, No acute distress. HEENT: [place] AT, moist mucus membranes. Trachea midline, no masses. Cardiovascular: No clubbing, cyanosis, or edema. Respiratory: Normal respiratory effort, no increased work of breathing. GI: Abdomen is soft, non tender, non distended, no abdominal masses. Liver and spleen not palpable.  No hernias appreciated.  Stool sample for occult testing is not indicated.   GU: No CVA tenderness.  No bladder fullness or masses.  Patient with circumcised phallus.   Urethral meatus is patent.  No penile discharge. No penile lesions or rashes. Scrotum without lesions, cysts, rashes and/or edema.  Testicles are located scrotally bilaterally. No masses are appreciated in the testicles. Left and right epididymis are normal. Rectal: Patient with  normal sphincter tone. Anus and perineum without scarring or rashes. No rectal masses are appreciated.  Prostate is approximately 55 grams, no nodules are appreciated. Seminal vesicles are normal. Skin: No rashes, bruises or suspicious lesions. Lymph: No cervical or inguinal adenopathy. Neurologic: Grossly intact, no focal deficits, moving all 4 extremities. Psychiatric: Normal mood and affect.   Laboratory Data: PSA history  2.1 ng/mL on 09/05/2015  2.6 ng/mL on 02/13/2016  2.9 ng/mL on 08/23/2016  2.4 ng/mL on 11/21/2016  3.4 ng/mL on 02/20/2017  3.0 ng/mL on 03/04/2017  2.9 ng/mL on 04/08/2017  3.4 ng/mL on 12/05/2017   Lab Results  Component Value Date   WBC 8.7 04/26/2015   HGB 15.0 12/05/2017   HCT 44.4 12/05/2017   MCV 90.8 04/26/2015   PLT 316 04/26/2015    Lab Results  Component Value Date   CREATININE 1.04 04/04/2017    Lab Results  Component Value Date   HGBA1C 5.5 04/04/2017   I have reviewed the labs.  Assessment & Plan:    1. Testosterone deficiency     -most recent testosterone level is 868 ng/dL on 12/05/2017  -reduce Clomid 50 mg, 1/2 tablet daily every other day  -RTC in 6 months for HCT, testosterone, PSA, LFT's, ADAM and exam  2. BPH with LUTS  - IPSS score is 2/0,  it is stable  - Continue conservative management, avoiding bladder irritants and timed voiding's  - RTC in 6 months for IPSS, PSA and exam, as testosterone therapy can cause prostate enlargement and worsen LUTS  3. Erectile dysfunction:     -SHIM score is 23, it is stable  -RTC in 6 months for SHIM score and exam, as testosterone therapy can affect erections  4. Increase in PSA velocity  - Patient's PSA has been increasing but it is just under 0.75 ng/mL over the last year  - will continue close  monitoring   - RTC in 6 months for PSA      Return in about 6 months (around 06/11/2018) for PSA, LFT's, HCT, HBG, testosterone (before 10 AM), IPSS, SHIM and exam.  Zara Council, [facility] [address] [place], [place]  [address] [phone]

## 2017-12-30 ENCOUNTER — Other Ambulatory Visit: Payer: Self-pay | Admitting: Family Medicine

## 2017-12-30 DIAGNOSIS — I10 Essential (primary) hypertension: Secondary | ICD-10-CM

## 2017-12-31 NOTE — Telephone Encounter (Signed)
Reviewed last Cr and K+ Rx approved 

## 2018-02-07 ENCOUNTER — Telehealth: Payer: Self-pay | Admitting: Urology

## 2018-02-07 NOTE — Telephone Encounter (Signed)
Pt called office stating that he went to pick up his Clomid and was told by his pharmacist that there will be no supply for Clomid until September.  Pt wants to know what to do, he will be out middle of next week. Please advise. Thanks.

## 2018-02-10 NOTE — Telephone Encounter (Signed)
The pharmacy is short on supply of the Clomid. Do you have any other suggestions for the time being?

## 2018-02-11 NOTE — Telephone Encounter (Signed)
Patient called back and said he may hold off, he will contact his insurance and see what his options are and get back with [place].   Sharyn Lull

## 2018-02-11 NOTE — Telephone Encounter (Signed)
We could switch to an exogenous form of testosterone as long as he is aware that this could potentially stop his body from making his own testosterone.   Exogenous testosterone comes in gels, pellets, buccal pellets, short acting IM injections, long acting IM injections and a sub q injection.  He may want to contact his insurance to see which testosterone they cover to help him make a decision.

## 2018-02-11 NOTE — Telephone Encounter (Signed)
LMOM for patient to return call.

## 2018-03-31 ENCOUNTER — Telehealth: Payer: Self-pay | Admitting: Family Medicine

## 2018-03-31 DIAGNOSIS — I10 Essential (primary) hypertension: Secondary | ICD-10-CM

## 2018-03-31 NOTE — Telephone Encounter (Signed)
Left voice message on home number earlier this morning informing pt to schedule appt with Dr Sanda Klein that she only called in a limited supply of his medication

## 2018-03-31 NOTE — Telephone Encounter (Signed)
Patient has not been seen in over a year Appointment please

## 2018-04-01 ENCOUNTER — Other Ambulatory Visit: Payer: Self-pay | Admitting: Family Medicine

## 2018-04-01 DIAGNOSIS — I10 Essential (primary) hypertension: Secondary | ICD-10-CM

## 2018-04-01 NOTE — Telephone Encounter (Signed)
Copied from [place] [id number]. Topic: Quick Communication - Rx Refill/Question >> Apr 01, 2018  6:22 PM Cecelia Byars, NT wrote: Reason for CRM: Patient called and said he will be out of his losartan before his appointment   on 04/21/18 ,he only has enough to last until this Friday the pharmacy did not fill the prescription for the 7 . Please advise [phone] or [phone]

## 2018-04-08 ENCOUNTER — Encounter: Payer: Self-pay | Admitting: Nurse Practitioner

## 2018-04-08 ENCOUNTER — Ambulatory Visit: Payer: BC Managed Care – PPO | Admitting: Nurse Practitioner

## 2018-04-08 VITALS — BP 138/76 | HR 69 | Temp 98.0°F | Resp 18 | Ht 72.0 in | Wt 183.4 lb

## 2018-04-08 DIAGNOSIS — F419 Anxiety disorder, unspecified: Secondary | ICD-10-CM

## 2018-04-08 DIAGNOSIS — I10 Essential (primary) hypertension: Secondary | ICD-10-CM

## 2018-04-08 DIAGNOSIS — Z720 Tobacco use: Secondary | ICD-10-CM

## 2018-04-08 LAB — BASIC METABOLIC PANEL WITH GFR
BUN: 19 mg/dL (ref 7–25)
CO2: 29 mmol/L (ref 20–32)
Calcium: 9.6 mg/dL (ref 8.6–10.3)
Chloride: 103 mmol/L (ref 98–110)
Creat: 1.08 mg/dL (ref 0.70–1.25)
GFR, EST AFRICAN AMERICAN: 85 mL/min/{1.73_m2} (ref >=60)
GFR, EST NON AFRICAN AMERICAN: 73 mL/min/{1.73_m2} (ref >=60)
Glucose, Bld: 89 mg/dL (ref 65–139)
Potassium: 4.5 mmol/L (ref 3.5–5.3)
Sodium: 140 mmol/L (ref 135–146)

## 2018-04-08 MED ORDER — SERTRALINE HCL 25 MG PO TABS: 25.0000 mg | ORAL_TABLET | Freq: Every day | ORAL | 0 refills | Status: DC

## 2018-04-08 NOTE — Patient Instructions (Addendum)
Goal blood pressure under 140/80   DASH Eating Plan DASH stands for "Dietary Approaches to Stop Hypertension." The DASH eating plan is a healthy eating plan that has been shown to reduce high blood pressure (hypertension). It may also reduce your risk for type 2 diabetes, heart disease, and stroke. The DASH eating plan may also help with weight loss. What are tips for following this plan? General guidelines  Avoid eating more than 2,300 mg (milligrams) of salt (sodium) a day. If you have hypertension, you may need to reduce your sodium intake to 1,500 mg a day.  Limit alcohol intake to no more than 1 drink a day for nonpregnant women and 2 drinks a day for men. One drink equals 12 oz of beer, 5 oz of wine, or 1 oz of hard liquor.  Work with your health care provider to maintain a healthy body weight or to lose weight. Ask what an ideal weight is for you.  Get at least 30 minutes of exercise that causes your heart to beat faster (aerobic exercise) most days of the week. Activities may include walking, swimming, or biking.  Work with your health care provider or diet and nutrition specialist (dietitian) to adjust your eating plan to your individual calorie needs. Reading food labels  Check food labels for the amount of sodium per serving. Choose foods with less than 5 percent of the Daily Value of sodium. Generally, foods with less than 300 mg of sodium per serving fit into this eating plan.  To find whole grains, look for the word "whole" as the first word in the ingredient list. Shopping  Buy products labeled as "low-sodium" or "no salt added."  Buy fresh foods. Avoid canned foods and premade or frozen meals. Cooking  Avoid adding salt when cooking. Use salt-free seasonings or herbs instead of table salt or sea salt. Check with your health care provider or pharmacist before using salt substitutes.  Do not fry foods. Cook foods using healthy methods such as baking, boiling, grilling,  and broiling instead.  Cook with heart-healthy oils, such as olive, canola, soybean, or sunflower oil. Meal planning   Eat a balanced diet that includes: ? 5 or more servings of fruits and vegetables each day. At each meal, try to fill half of your plate with fruits and vegetables. ? Up to 6-8 servings of whole grains each day. ? Less than 6 oz of lean meat, poultry, or fish each day. A 3-oz serving of meat is about the same size as a deck of cards. One egg equals 1 oz. ? 2 servings of low-fat dairy each day. ? A serving of nuts, seeds, or beans 5 times each week. ? Heart-healthy fats. Healthy fats called Omega-3 fatty acids are found in foods such as flaxseeds and coldwater fish, like sardines, salmon, and mackerel.  Limit how much you eat of the following: ? Canned or prepackaged foods. ? Food that is high in trans fat, such as fried foods. ? Food that is high in saturated fat, such as fatty meat. ? Sweets, desserts, sugary drinks, and other foods with added sugar. ? Full-fat dairy products.  Do not salt foods before eating.  Try to eat at least 2 vegetarian meals each week.  Eat more home-cooked food and less restaurant, buffet, and fast food.  When eating at a restaurant, ask that your food be prepared with less salt or no salt, if possible. What foods are recommended? The items listed may not be a complete  list. Talk with your dietitian about what dietary choices are best for you. Grains Whole-grain or whole-wheat bread. Whole-grain or whole-wheat pasta. Brown rice. [place]. Bulgur. Whole-grain and low-sodium cereals. Pita bread. Low-fat, low-sodium crackers. Whole-wheat flour tortillas. Vegetables Fresh or frozen vegetables (raw, steamed, roasted, or grilled). Low-sodium or reduced-sodium tomato and vegetable juice. Low-sodium or reduced-sodium tomato sauce and tomato paste. Low-sodium or reduced-sodium canned vegetables. Fruits All fresh, dried, or frozen fruit.  Canned fruit in natural juice (without added sugar). Meat and other protein foods Skinless chicken or [place]. Ground chicken or [place]. Pork with fat trimmed off. Fish and seafood. Egg whites. Dried beans, peas, or lentils. Unsalted nuts, nut butters, and seeds. Unsalted canned beans. Lean cuts of beef with fat trimmed off. Low-sodium, lean deli meat. Dairy Low-fat (1%) or fat-free (skim) milk. Fat-free, low-fat, or reduced-fat cheeses. Nonfat, low-sodium ricotta or cottage cheese. Low-fat or nonfat yogurt. Low-fat, low-sodium cheese. Fats and oils Soft margarine without trans fats. Vegetable oil. Low-fat, reduced-fat, or light mayonnaise and salad dressings (reduced-sodium). Canola, safflower, olive, soybean, and sunflower oils. Avocado. Seasoning and other foods Herbs. Spices. Seasoning mixes without salt. Unsalted popcorn and pretzels. Fat-free sweets. What foods are not recommended? The items listed may not be a complete list. Talk with your dietitian about what dietary choices are best for you. Grains Baked goods made with fat, such as croissants, muffins, or some breads. Dry pasta or rice meal packs. Vegetables Creamed or fried vegetables. Vegetables in a cheese sauce. Regular canned vegetables (not low-sodium or reduced-sodium). Regular canned tomato sauce and paste (not low-sodium or reduced-sodium). Regular tomato and vegetable juice (not low-sodium or reduced-sodium). Angie Fava. Olives. Fruits Canned fruit in a light or heavy syrup. Fried fruit. Fruit in cream or butter sauce. Meat and other protein foods Fatty cuts of meat. Ribs. Fried meat. Berniece Salines. Sausage. Bologna and other processed lunch meats. Salami. Fatback. Hotdogs. Bratwurst. Salted nuts and seeds. Canned beans with added salt. Canned or smoked fish. Whole eggs or egg yolks. Chicken or [place] with skin. Dairy Whole or 2% milk, cream, and half-and-half. Whole or full-fat cream cheese. Whole-fat or sweetened yogurt. Full-fat  cheese. Nondairy creamers. Whipped toppings. Processed cheese and cheese spreads. Fats and oils Butter. Stick margarine. Lard. Shortening. Ghee. Bacon fat. Tropical oils, such as coconut, palm kernel, or palm oil. Seasoning and other foods Salted popcorn and pretzels. Onion salt, garlic salt, seasoned salt, table salt, and sea salt. Worcestershire sauce. Tartar sauce. Barbecue sauce. Teriyaki sauce. Soy sauce, including reduced-sodium. Steak sauce. Canned and packaged gravies. Fish sauce. Oyster sauce. Cocktail sauce. Horseradish that you find on the shelf. Ketchup. Mustard. Meat flavorings and tenderizers. Bouillon cubes. Hot sauce and Tabasco sauce. Premade or packaged marinades. Premade or packaged taco seasonings. Relishes. Regular salad dressings. Where to find more information:  National Heart, Lung, and [place]: https://wilson-eaton.com/  American Heart Association: www.heart.org Summary  The DASH eating plan is a healthy eating plan that has been shown to reduce high blood pressure (hypertension). It may also reduce your risk for type 2 diabetes, heart disease, and stroke.  With the DASH eating plan, you should limit salt (sodium) intake to 2,300 mg a day. If you have hypertension, you may need to reduce your sodium intake to 1,500 mg a day.  When on the DASH eating plan, aim to eat more fresh fruits and vegetables, whole grains, lean proteins, low-fat dairy, and heart-healthy fats.  Work with your health care provider or diet and nutrition specialist (dietitian) to  adjust your eating plan to your individual calorie needs. This information is not intended to replace advice given to you by your health care provider. Make sure you discuss any questions you have with your health care provider. Document Released: 10/04/2011 Document Revised: 10/08/2016 Document Reviewed: 10/08/2016 Elsevier Interactive Patient Education  2018 [place] with Quitting Smoking Quitting smoking  is a physical and mental challenge. You will face cravings, withdrawal symptoms, and temptation. Before quitting, work with your health care provider to make a plan that can help you cope. Preparation can help you quit and keep you from giving in. How can I cope with cravings? Cravings usually last for 5-10 minutes. If you get through it, the craving will pass. Consider taking the following actions to help you cope with cravings:  Keep your mouth busy: ? Chew sugar-free gum. ? Suck on hard candies or a straw. ? Brush your teeth.  Keep your hands and body busy: ? Immediately change to a different activity when you feel a craving. ? Squeeze or play with a ball. ? Do an activity or a hobby, like making bead jewelry, practicing needlepoint, or working with wood. ? Mix up your normal routine. ? Take a short exercise break. Go for a quick walk or run up and down stairs. ? Spend time in public places where smoking is not allowed.  Focus on doing something kind or helpful for someone else.  Call a friend or family member to talk during a craving.  Join a support group.  Call a quit line, such as 1-800-QUIT-NOW.  Talk with your health care provider about medicines that might help you cope with cravings and make quitting easier for you.  How can I deal with withdrawal symptoms? Your body may experience negative effects as it tries to get used to not having nicotine in the system. These effects are called withdrawal symptoms. They may include:  Feeling hungrier than normal.  Trouble concentrating.  Irritability.  Trouble sleeping.  Feeling depressed.  Restlessness and agitation.  Craving a cigarette.  To manage withdrawal symptoms:  Avoid places, people, and activities that trigger your cravings.  Remember why you want to quit.  Get plenty of sleep.  Avoid coffee and other caffeinated drinks. These may worsen some of your symptoms.  How can I handle social  situations? Social situations can be difficult when you are quitting smoking, especially in the first few weeks. To manage this, you can:  Avoid parties, bars, and other social situations where people might be smoking.  Avoid alcohol.  Leave right away if you have the urge to smoke.  Explain to your family and friends that you are quitting smoking. Ask for understanding and support.  Plan activities with friends or family where smoking is not an option.  What are some ways I can cope with stress? Wanting to smoke may cause stress, and stress can make you want to smoke. Find ways to manage your stress. Relaxation techniques can help. For example:  Breathe slowly and deeply, in through your nose and out through your mouth.  Listen to soothing, relaxing music.  Talk with a family member or friend about your stress.  Light a candle.  Soak in a bath or take a shower.  Think about a peaceful place.  What are some ways I can prevent weight gain? Be aware that many people gain weight after they quit smoking. However, not everyone does. To keep from gaining weight, have a plan  in place before you quit and stick to the plan after you quit. Your plan should include:  Having healthy snacks. When you have a craving, it may help to: ? Eat plain popcorn, crunchy carrots, celery, or other cut vegetables. ? Chew sugar-free gum.  Changing how you eat: ? Eat small portion sizes at meals. ? Eat 4-6 small meals throughout the day instead of 1-2 large meals a day. ? Be mindful when you eat. Do not watch television or do other things that might distract you as you eat.  Exercising regularly: ? Make time to exercise each day. If you do not have time for a long workout, do short bouts of exercise for 5-10 minutes several times a day. ? Do some form of strengthening exercise, like weight lifting, and some form of aerobic exercise, like running or swimming.  Drinking plenty of water or other  low-calorie or no-calorie drinks. Drink 6-8 glasses of water daily, or as much as instructed by your health care provider.  Summary  Quitting smoking is a physical and mental challenge. You will face cravings, withdrawal symptoms, and temptation to smoke again. Preparation can help you as you go through these challenges.  You can cope with cravings by keeping your mouth busy (such as by chewing gum), keeping your body and hands busy, and making calls to family, friends, or a helpline for people who want to quit smoking.  You can cope with withdrawal symptoms by avoiding places where people smoke, avoiding drinks with caffeine, and getting plenty of rest.  Ask your health care provider about the different ways to prevent weight gain, avoid stress, and handle social situations. This information is not intended to replace advice given to you by your health care provider. Make sure you discuss any questions you have with your health care provider. Document Released: 10/12/2016 Document Revised: 10/12/2016 Document Reviewed: 10/12/2016 Elsevier Interactive Patient Education  [employer].

## 2018-04-08 NOTE — Progress Notes (Addendum)
Name: Philip Lynch   MRN: [id number]    DOB: [birth year]   Date:04/08/2018       Progress Note  Subjective  Chief Complaint  Chief Complaint  Patient presents with  . Hypertension    medication refill  . Depression    HPI  Hypertension Patient is on lorsartan 100mg  daily .  Takes medications as prescribed with no missed doses a month.  Is compliant with low-salt diet.  He does checks blood pressures at home with range of 128-133/76-88 Denies chest pain, headaches, blurry vision.  Depression/anxiety  Works well has been on this dose for over a year. sts triggered after traumatic accident in the past- dose has been titrated down in the past and is stable here.  Depression screen [facility] 2/9 04/08/2018  Decreased Interest 0  Down, Depressed, Hopeless 0  PHQ - 2 Score 0  Altered sleeping 0  Tired, decreased energy 0  Change in appetite 0  Feeling bad or failure about yourself  0  Trouble concentrating 0  Moving slowly or fidgety/restless 0  Suicidal thoughts 0  PHQ-9 Score 0  Difficult doing work/chores Not difficult at all      Patient Active Problem List   Diagnosis Date Noted  . Medication monitoring encounter 02/22/2017  . Dyslipidemia 02/24/2016  . 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 Personal history of colonic polyps   . Benign neoplasm of sigmoid colon   . BPH without obstruction/lower urinary tract symptoms 09/05/2015  . Hypogonadism in male 08/26/2015  . Excessive gas 07/24/2015  . Low back pain 07/24/2015  . Hx of colonic polyps 07/22/2015  . Groin pain 07/22/2015  . GERD without esophagitis 07/22/2015  . Osteopenia 06/27/2015  . Tobacco abuse 05/27/2015  . Impaired fasting glucose 05/02/2015  . Hyperglycemia 04/28/2015  . Restless legs syndrome 04/28/2015  . Essential hypertension, benign 04/28/2015  . LVH (left ventricular hypertrophy) 04/28/2015  . Shaking 04/28/2015  . Periodic limb movement disorder 04/28/2015    Past Medical  History:  Diagnosis Date  . Acid reflux   . Anxiety   . Arthritis    knees  . Colon polyp   . Depression   . Heart burn   . History of kidney stones   . Hypertension   . Impaired fasting glucose 05/02/2015  . RLS (restless legs syndrome)   . Vertigo    (x1) several months ago    Past Surgical History:  Procedure Laterality Date  . COLONOSCOPY  2012   Dr. Tobias Alexander  . COLONOSCOPY WITH PROPOFOL N/A 10/06/2015   Procedure: COLONOSCOPY WITH PROPOFOL;  Surgeon: Lucilla Lame, MD;  Location: [place];  Service: Endoscopy;  Laterality: N/A;  . HERNIA REPAIR Right 02/01/15   inguinal   . POLYPECTOMY  10/06/2015   Procedure: POLYPECTOMY;  Surgeon: Lucilla Lame, MD;  Location: [place];  Service: Endoscopy;;  . UPPER GI ENDOSCOPY  2012    Social History   Tobacco Use  . Smoking status: Current Some Day Smoker    Packs/day: 1.00    Years: 30.00    Pack years: 30.00    Types: Cigarettes    Start date: 08/27/1986  . Smokeless tobacco: Never Used  . Tobacco comment: started Nicotine for 1 month  Substance Use Topics  . Alcohol use: No    Alcohol/week: 0.0 oz     Current Outpatient Medications:  .  aspirin EC 81 MG tablet, Take 1 tablet (81 mg total) by mouth daily. (if  taking ibuprofen, take the aspirin at least 1 hour before the ibuprofen), Disp: 30 tablet, Rfl: 11 .  Cholecalciferol (VITAMIN D-1000 MAX ST) 1000 UNITS tablet, Take by mouth., Disp: , Rfl:  .  fluticasone (FLONASE) 50 MCG/ACT nasal spray, Reported on 02/13/2016, Disp: , Rfl: 0 .  ibuprofen (ADVIL,MOTRIN) 200 MG tablet, Take 200 mg by mouth every 6 (six) hours as needed., Disp: , Rfl:  .  losartan (COZAAR) 100 MG tablet, TAKE 1 TABLET BY MOUTH EVERY DAY, Disp: 7 tablet, Rfl: 0 .  ranitidine (ZANTAC) 300 MG tablet, TAKE 1 TABLET (300 MG TOTAL) BY MOUTH AT BEDTIME. TO HELP WITH REFLUX AND HEARTBURN, Disp: 30 tablet, Rfl: 11 .  sertraline (ZOLOFT) 25 MG tablet, TAKE 1 TABLET (25 MG TOTAL) BY MOUTH  DAILY., Disp: 7 tablet, Rfl: 0 .  clomiPHENE (CLOMID) 50 MG tablet, TAKE ONE-HALF TABLET BY MOUTH EVERY OTHER DAY (Patient not taking: Reported on 04/08/2018), Disp: 30 tablet, Rfl: 0  No Known Allergies  ROS .  No other specific complaints in a complete review of systems (except as listed in HPI above).  Objective  Vitals:   04/08/18 1546  BP: 138/76  Pulse: 69  Resp: 18  Temp: 98 F (36.7 C)  TempSrc: Oral  SpO2: 98%  Weight: 183 lb 6.4 oz (83.2 kg)  Height: 6' (1.829 m)    Body mass index is 24.87 kg/m.  Nursing Note and Vital Signs reviewed.  Physical Exam   Constitutional: Patient appears well-developed and well-nourished. No distress.  Cardiovascular: Normal rate, regular rhythm, S1/S2 present.  No murmur or rub heard. Pulses intact, no carotid bruits noted.  Pulmonary/Chest: Effort normal and breath sounds clear. No respiratory distress or retractions. Psychiatric: Patient has a normal mood and affect. behavior is normal. Judgment and thought content normal.  No results found for this or any previous visit (from the past 72 hour(s)).  Assessment & Plan  1. Essential hypertension, benign - refill meds if blood works is normal  - BASIC METABOLIC PANEL WITH GFR  2. Anxiety Stable  - sertraline (ZOLOFT) 25 MG tablet; Take 1 tablet (25 mg total) by mouth daily.  Dispense: 7 tablet; Refill: 0   3. Tobacco use -pt insurance covers nicotine replacement plans to use to cut down sts smokes currently about 4-6 ciggs a day.   -Red flags and when to present for emergency care or RTC including fever >101.23F, chest pain, shortness of breath, new/worsening/un-resolving symptoms, reviewed with patient at time of visit. Follow up and care instructions discussed and provided in AVS. -Reviewed Health Maintenance: pt had appt needed to leave early, plan to re-address hep c and tdap at physical in three months  >3 minutes spent counseling on smoking  cessation -------------------------------------------- I have reviewed this encounter including the documentation in this note and/or discussed this patient with the provider, Suezanne Cheshire DNP AGNP-C. I am certifying that I agree with the content of this note as supervising physician. Enid Derry, [facility] 04/08/2018, 5:18 PM

## 2018-04-09 ENCOUNTER — Other Ambulatory Visit: Payer: Self-pay | Admitting: Nurse Practitioner

## 2018-04-09 DIAGNOSIS — I10 Essential (primary) hypertension: Secondary | ICD-10-CM

## 2018-04-09 MED ORDER — LOSARTAN POTASSIUM 100 MG PO TABS: 100.0000 mg | ORAL_TABLET | Freq: Every day | ORAL | 1 refills | Status: DC

## 2018-04-21 ENCOUNTER — Ambulatory Visit: Payer: BC Managed Care – PPO | Admitting: Family Medicine

## 2018-04-22 ENCOUNTER — Other Ambulatory Visit: Payer: Self-pay | Admitting: Nurse Practitioner

## 2018-04-22 DIAGNOSIS — F419 Anxiety disorder, unspecified: Secondary | ICD-10-CM

## 2018-04-23 ENCOUNTER — Other Ambulatory Visit: Payer: Self-pay | Admitting: Nurse Practitioner

## 2018-04-23 DIAGNOSIS — F419 Anxiety disorder, unspecified: Secondary | ICD-10-CM

## 2018-04-23 MED ORDER — SERTRALINE HCL 25 MG PO TABS: 25.0000 mg | ORAL_TABLET | Freq: Every day | ORAL | 0 refills | Status: DC

## 2018-06-03 ENCOUNTER — Other Ambulatory Visit: Payer: Self-pay | Admitting: Family Medicine

## 2018-06-06 ENCOUNTER — Other Ambulatory Visit: Payer: BC Managed Care – PPO

## 2018-06-11 ENCOUNTER — Ambulatory Visit: Payer: BC Managed Care – PPO | Admitting: Urology

## 2018-07-10 ENCOUNTER — Encounter: Payer: Self-pay | Admitting: Family Medicine

## 2018-07-10 ENCOUNTER — Ambulatory Visit: Payer: BC Managed Care – PPO | Admitting: Family Medicine

## 2018-07-10 DIAGNOSIS — R7301 Impaired fasting glucose: Secondary | ICD-10-CM | POA: Diagnosis not present

## 2018-07-10 DIAGNOSIS — E785 Hyperlipidemia, unspecified: Secondary | ICD-10-CM

## 2018-07-10 DIAGNOSIS — Z5181 Encounter for therapeutic drug level monitoring: Secondary | ICD-10-CM

## 2018-07-10 DIAGNOSIS — I10 Essential (primary) hypertension: Secondary | ICD-10-CM | POA: Diagnosis not present

## 2018-07-10 MED ORDER — ATORVASTATIN CALCIUM 40 MG PO TABS
40.0000 mg | ORAL_TABLET | Freq: Every day | ORAL | 1 refills | Status: DC
Start: 2018-07-10 — End: 2018-08-13

## 2018-07-10 NOTE — Progress Notes (Signed)
BP (!) 152/82   Pulse 68   Temp 98.2 F (36.8 C) (Oral)   Resp 12   Ht 6' (1.829 m)   Wt 185 lb 1.6 oz (84 kg)   SpO2 99%   BMI 25.10 kg/m    Subjective:    Patient ID: Philip Lynch, male    DOB: 08/17/1956, 62 y.o.   MRN: [id number]  HPI: Philip Lynch is a 62 y.o. male  Chief Complaint  Patient presents with  . 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; works long days, on his feet, working on the weekends building house Seeing Zara Council for prostate issues HTN; high today; checks 2-3 x a week, runs around 130-133 on top, lower 80s He cannot walk in the heat; he gets home in the evenings and does not want to get out He takes ibuprofen; "it depends", maybe 2-3 days a week He does not want to add another medicine; he'll check the BP He is eating out more often  GERD; ranitidine has been doing well; no blood in the stool  On aspirin The 10-year ASCVD risk score [name] DC Jr., et al., 2013) is: 27.7%   Values used to calculate the score:     Age: 66 years     Sex: Male     Is Non-Hispanic African American: No     Diabetic: No     Tobacco smoker: Yes     Systolic Blood Pressure: 785 mmHg     Is BP treated: Yes     HDL Cholesterol: 41 mg/dL     Total Cholesterol: 219 mg/dL  High cholesterol; eating out a lot; eating some chicken, not always grilled; occasional fish; lots of burgers which is terrible for cholesterol he admits; not much cheese; not many eggs  Prediabetes; family members with diabetes are maternal uncle and that's it; not much dry mouth; no weight loss, no blurred vision; some sweet tea  Depression screen [facility] 2/9 07/10/2018 04/08/2018 02/22/2017 08/27/2016 02/24/2016  Decreased Interest 0 0 0 0 0  Down, Depressed, Hopeless 0 0 0 0 0  PHQ - 2 Score 0 0 0 0 0  Altered sleeping 0 0 - - -  Tired, decreased energy 0 0 - - -  Change in appetite 0 0 -  - -  Feeling bad or failure about yourself  0 0 - - -  Trouble concentrating 0 0 - - -  Moving slowly or fidgety/restless 0 0 - - -  Suicidal thoughts 0 0 - - -  PHQ-9 Score 0 0 - - -  Difficult doing work/chores Not difficult at all Not difficult at all - - -    Relevant past medical, surgical, family and social history reviewed Past Medical History:  Diagnosis Date  . Acid reflux   . Anxiety   . Arthritis    knees  . Colon polyp   . Depression   . Heart burn   . History of kidney stones   . Hypertension   . Impaired fasting glucose 05/02/2015  . RLS (restless legs syndrome)   . Vertigo    (x1) several months ago   Past Surgical History:  Procedure Laterality Date  . COLONOSCOPY  2012   Dr. Tobias Alexander  . COLONOSCOPY WITH PROPOFOL N/A 10/06/2015   Procedure: COLONOSCOPY WITH PROPOFOL;  Surgeon: Lucilla Lame, MD;  Location:  [place];  Service: Endoscopy;  Laterality: N/A;  . HERNIA REPAIR Right 02/01/15   inguinal   . POLYPECTOMY  10/06/2015   Procedure: POLYPECTOMY;  Surgeon: Lucilla Lame, MD;  Location: [place];  Service: Endoscopy;;  . UPPER GI ENDOSCOPY  2012   Family History  Problem Relation Age of Onset  . COPD Mother   . Hypertension Mother   . Heart attack Father   . Heart disease Father   . Hypertension Father   . Diabetes Maternal Uncle   . Cancer Maternal Grandmother        colon  . Kidney disease Neg Hx   . Prostate cancer Neg Hx    Social History   Tobacco Use  . Smoking status: Current Some Day Smoker    Packs/day: 1.00    Years: 30.00    Pack years: 30.00    Types: Cigarettes    Start date: 08/27/1986  . Smokeless tobacco: Never Used  . Tobacco comment: started Nicotine for 1 month  Substance Use Topics  . Alcohol use: No    Alcohol/week: 0.0 standard drinks  . Drug use: No  MD note: he is down to 1/2 ppd; can't smoke at work  Interim medical history since last visit reviewed. Allergies and medications  reviewed  Review of Systems Per HPI unless specifically indicated above     Objective:    BP (!) 152/82   Pulse 68   Temp 98.2 F (36.8 C) (Oral)   Resp 12   Ht 6' (1.829 m)   Wt 185 lb 1.6 oz (84 kg)   SpO2 99%   BMI 25.10 kg/m   Wt Readings from Last 3 Encounters:  07/10/18 185 lb 1.6 oz (84 kg)  04/08/18 183 lb 6.4 oz (83.2 kg)  12/12/17 187 lb (84.8 kg)    Physical Exam  Constitutional: He appears well-developed and well-nourished. No distress.  HENT:  Head: Normocephalic and atraumatic.  Eyes: EOM are normal. No scleral icterus.  Neck: No thyromegaly present.  Cardiovascular: Normal rate and regular rhythm.  Pulmonary/Chest: Effort normal and breath sounds normal.  Abdominal: Soft. Bowel sounds are normal. He exhibits no distension.  Musculoskeletal: He exhibits no edema.  Neurological: Coordination normal.  Skin: Skin is warm and dry. No pallor.  Psychiatric: He has a normal mood and affect. His behavior is normal. Judgment and thought content normal.   Results for orders placed or performed in visit on 16/10/96  BASIC METABOLIC PANEL WITH GFR  Result Value Ref Range   Glucose, Bld 89 65 - 139 mg/dL   BUN 19 7 - 25 mg/dL   Creat 1.08 0.70 - 1.25 mg/dL   GFR, Est Non African American 73 > OR = 60 mL/min/1.65m2   GFR, Est African American 85 > OR = 60 mL/min/1.46m2   BUN/Creatinine Ratio NOT APPLICABLE 6 - 22 (calc)   Sodium 140 135 - 146 mmol/L   Potassium 4.5 3.5 - 5.3 mmol/L   Chloride 103 98 - 110 mmol/L   CO2 29 20 - 32 mmol/L   Calcium 9.6 8.6 - 10.3 mg/dL      Assessment & Plan:   Problem List Items Addressed This Visit      Cardiovascular and Mediastinum   Essential hypertension, benign (Chronic)    Patient does not wish to add medicine; patient will monitor; try DASH guidelines      Relevant Medications   atorvastatin (LIPITOR) 40 MG tablet     Endocrine  Impaired fasting glucose (Chronic)    Check A1c      Relevant Orders    Hemoglobin A1c     Other   Medication monitoring encounter    Monitor liver and kidneys periodically      Dyslipidemia (Chronic)    Check lipids; he wishes to try medicine; discussed ASCVD risk and he agrees to start statin; continue aspirin      Relevant Medications   atorvastatin (LIPITOR) 40 MG tablet   Other Relevant Orders   Lipid panel   Lipid panel       Follow up plan: Return in about 6 months (around 01/08/2019) for follow-up visit with Dr. Sanda Klein as long as BP is good.  An after-visit summary was printed and given to the patient at [facility].  Please see the patient instructions which may contain other information and recommendations beyond what is mentioned above in the assessment and plan.  Meds ordered this encounter  Medications  . atorvastatin (LIPITOR) 40 MG tablet    Sig: Take 1 tablet (40 mg total) by mouth at bedtime.    Dispense:  30 tablet    Refill:  1    Orders Placed This Encounter  Procedures  . Hemoglobin A1c  . Lipid panel  . Lipid panel

## 2018-07-10 NOTE — Assessment & Plan Note (Signed)
Check lipids; he wishes to try medicine; discussed ASCVD risk and he agrees to start statin; continue aspirin

## 2018-07-10 NOTE — Assessment & Plan Note (Signed)
Patient does not wish to add medicine; patient will monitor; try DASH guidelines

## 2018-07-10 NOTE — Patient Instructions (Addendum)
If you need something for aches or pains, try to use Tylenol (acetaminophen) instead of non-steroidals (which include Aleve, ibuprofen, Advil, Motrin, and naproxen); non-steroidals can cause long-term kidney damage  Try to follow the DASH guidelines (DASH stands for Dietary Approaches to Stop Hypertension). Try to limit the sodium in your diet to no more than 1,500mg  of sodium per day. Certainly try to not exceed 2,000 mg per day at the very most. Do not add salt when cooking or at the table.  Check the sodium amount on labels when shopping, and choose items lower in sodium when given a choice. Avoid or limit foods that already contain a lot of sodium. Eat a diet rich in fruits and vegetables and whole grains, and try to lose weight if overweight or obese  Monitor your blood pressure and contact me if not consistently under 140 on top; 120 range would be great   DASH Eating Plan DASH stands for "Dietary Approaches to Stop Hypertension." The DASH eating plan is a healthy eating plan that has been shown to reduce high blood pressure (hypertension). It may also reduce your risk for type 2 diabetes, heart disease, and stroke. The DASH eating plan may also help with weight loss. What are tips for following this plan? General guidelines  Avoid eating more than 2,300 mg (milligrams) of salt (sodium) a day. If you have hypertension, you may need to reduce your sodium intake to 1,500 mg a day.  Limit alcohol intake to no more than 1 drink a day for nonpregnant women and 2 drinks a day for men. One drink equals 12 oz of beer, 5 oz of wine, or 1 oz of hard liquor.  Work with your health care provider to maintain a healthy body weight or to lose weight. Ask what an ideal weight is for you.  Get at least 30 minutes of exercise that causes your heart to beat faster (aerobic exercise) most days of the week. Activities may include walking, swimming, or biking.  Work with your health care provider or diet and  nutrition specialist (dietitian) to adjust your eating plan to your individual calorie needs. Reading food labels  Check food labels for the amount of sodium per serving. Choose foods with less than 5 percent of the Daily Value of sodium. Generally, foods with less than 300 mg of sodium per serving fit into this eating plan.  To find whole grains, look for the word "whole" as the first word in the ingredient list. Shopping  Buy products labeled as "low-sodium" or "no salt added."  Buy fresh foods. Avoid canned foods and premade or frozen meals. Cooking  Avoid adding salt when cooking. Use salt-free seasonings or herbs instead of table salt or sea salt. Check with your health care provider or pharmacist before using salt substitutes.  Do not fry foods. Cook foods using healthy methods such as baking, boiling, grilling, and broiling instead.  Cook with heart-healthy oils, such as olive, canola, soybean, or sunflower oil. Meal planning   Eat a balanced diet that includes: ? 5 or more servings of fruits and vegetables each day. At each meal, try to fill half of your plate with fruits and vegetables. ? Up to 6-8 servings of whole grains each day. ? Less than 6 oz of lean meat, poultry, or fish each day. A 3-oz serving of meat is about the same size as a deck of cards. One egg equals 1 oz. ? 2 servings of low-fat dairy each day. ?  A serving of nuts, seeds, or beans 5 times each week. ? Heart-healthy fats. Healthy fats called Omega-3 fatty acids are found in foods such as flaxseeds and coldwater fish, like sardines, salmon, and mackerel.  Limit how much you eat of the following: ? Canned or prepackaged foods. ? Food that is high in trans fat, such as fried foods. ? Food that is high in saturated fat, such as fatty meat. ? Sweets, desserts, sugary drinks, and other foods with added sugar. ? Full-fat dairy products.  Do not salt foods before eating.  Try to eat at least 2 vegetarian  meals each week.  Eat more home-cooked food and less restaurant, buffet, and fast food.  When eating at a restaurant, ask that your food be prepared with less salt or no salt, if possible. What foods are recommended? The items listed may not be a complete list. Talk with your dietitian about what dietary choices are best for you. Grains Whole-grain or whole-wheat bread. Whole-grain or whole-wheat pasta. Brown rice. [place] Morrow. Bulgur. Whole-grain and low-sodium cereals. Pita bread. Low-fat, low-sodium crackers. Whole-wheat flour tortillas. Vegetables Fresh or frozen vegetables (raw, steamed, roasted, or grilled). Low-sodium or reduced-sodium tomato and vegetable juice. Low-sodium or reduced-sodium tomato sauce and tomato paste. Low-sodium or reduced-sodium canned vegetables. Fruits All fresh, dried, or frozen fruit. Canned fruit in natural juice (without added sugar). Meat and other protein foods Skinless chicken or [place]. Ground chicken or [place]. Pork with fat trimmed off. Fish and seafood. Egg whites. Dried beans, peas, or lentils. Unsalted nuts, nut butters, and seeds. Unsalted canned beans. Lean cuts of beef with fat trimmed off. Low-sodium, lean deli meat. Dairy Low-fat (1%) or fat-free (skim) milk. Fat-free, low-fat, or reduced-fat cheeses. Nonfat, low-sodium ricotta or cottage cheese. Low-fat or nonfat yogurt. Low-fat, low-sodium cheese. Fats and oils Soft margarine without trans fats. Vegetable oil. Low-fat, reduced-fat, or light mayonnaise and salad dressings (reduced-sodium). Canola, safflower, olive, soybean, and sunflower oils. Avocado. Seasoning and other foods Herbs. Spices. Seasoning mixes without salt. Unsalted popcorn and pretzels. Fat-free sweets. What foods are not recommended? The items listed may not be a complete list. Talk with your dietitian about what dietary choices are best for you. Grains Baked goods made with fat, such as croissants, muffins, or some  breads. Dry pasta or rice meal packs. Vegetables Creamed or fried vegetables. Vegetables in a cheese sauce. Regular canned vegetables (not low-sodium or reduced-sodium). Regular canned tomato sauce and paste (not low-sodium or reduced-sodium). Regular tomato and vegetable juice (not low-sodium or reduced-sodium). Angie Fava. Olives. Fruits Canned fruit in a light or heavy syrup. Fried fruit. Fruit in cream or butter sauce. Meat and other protein foods Fatty cuts of meat. Ribs. Fried meat. Berniece Salines. Sausage. Bologna and other processed lunch meats. Salami. Fatback. Hotdogs. Bratwurst. Salted nuts and seeds. Canned beans with added salt. Canned or smoked fish. Whole eggs or egg yolks. Chicken or [place] with skin. Dairy Whole or 2% milk, cream, and half-and-half. Whole or full-fat cream cheese. Whole-fat or sweetened yogurt. Full-fat cheese. Nondairy creamers. Whipped toppings. Processed cheese and cheese spreads. Fats and oils Butter. Stick margarine. Lard. Shortening. Ghee. Bacon fat. Tropical oils, such as coconut, palm kernel, or palm oil. Seasoning and other foods Salted popcorn and pretzels. Onion salt, garlic salt, seasoned salt, table salt, and sea salt. Worcestershire sauce. Tartar sauce. Barbecue sauce. Teriyaki sauce. Soy sauce, including reduced-sodium. Steak sauce. Canned and packaged gravies. Fish sauce. Oyster sauce. Cocktail sauce. Horseradish that you find on the shelf. Ketchup.  Mustard. Meat flavorings and tenderizers. Bouillon cubes. Hot sauce and Tabasco sauce. Premade or packaged marinades. Premade or packaged taco seasonings. Relishes. Regular salad dressings. Where to find more information:  National Heart, Lung, and [place]: https://wilson-eaton.com/  American Heart Association: www.heart.org Summary  The DASH eating plan is a healthy eating plan that has been shown to reduce high blood pressure (hypertension). It may also reduce your risk for type 2 diabetes, heart disease, and  stroke.  With the DASH eating plan, you should limit salt (sodium) intake to 2,300 mg a day. If you have hypertension, you may need to reduce your sodium intake to 1,500 mg a day.  When on the DASH eating plan, aim to eat more fresh fruits and vegetables, whole grains, lean proteins, low-fat dairy, and heart-healthy fats.  Work with your health care provider or diet and nutrition specialist (dietitian) to adjust your eating plan to your individual calorie needs. This information is not intended to replace advice given to you by your health care provider. Make sure you discuss any questions you have with your health care provider. Document Released: 10/04/2011 Document Revised: 10/08/2016 Document Reviewed: 10/08/2016 Elsevier Interactive Patient Education  [employer].  I do encourage you to quit smoking Call [phone] to sign up for smoking cessation classes You can call 1-800-QUIT-NOW to talk with a smoking cessation coach  Start the cholesterol medicine Okay to come in the week before your move, late October for your cholesterol

## 2018-07-10 NOTE — Assessment & Plan Note (Signed)
Monitor liver and kidneys periodically

## 2018-07-10 NOTE — Assessment & Plan Note (Signed)
Check A1c. 

## 2018-07-11 LAB — LIPID PANEL
Cholesterol: 221 mg/dL — ABNORMAL HIGH (ref <200)
HDL: 43 mg/dL (ref >40)
LDL Cholesterol (Calc): 124 mg/dL (calc) — ABNORMAL HIGH
Non-HDL Cholesterol (Calc): 178 mg/dL (calc) — ABNORMAL HIGH (ref <130)
Total CHOL/HDL Ratio: 5.1 (calc) — ABNORMAL HIGH (ref <5.0)
Triglycerides: 376 mg/dL — ABNORMAL HIGH (ref <150)

## 2018-07-11 LAB — HEMOGLOBIN A1C
EAG (MMOL/L): 6.2 (calc)
HEMOGLOBIN A1C: 5.5 %{Hb} (ref <5.7)
Mean Plasma Glucose: 111 (calc)

## 2018-07-28 ENCOUNTER — Other Ambulatory Visit: Payer: Self-pay | Admitting: Nurse Practitioner

## 2018-07-28 DIAGNOSIS — F419 Anxiety disorder, unspecified: Secondary | ICD-10-CM

## 2018-08-13 ENCOUNTER — Other Ambulatory Visit: Payer: Self-pay | Admitting: Family Medicine

## 2018-09-05 ENCOUNTER — Other Ambulatory Visit: Payer: Self-pay | Admitting: Family Medicine

## 2018-09-17 ENCOUNTER — Other Ambulatory Visit: Payer: Self-pay | Admitting: Family Medicine

## 2018-09-30 ENCOUNTER — Other Ambulatory Visit: Payer: Self-pay | Admitting: Nurse Practitioner

## 2018-09-30 DIAGNOSIS — I10 Essential (primary) hypertension: Secondary | ICD-10-CM

## 2018-10-01 ENCOUNTER — Other Ambulatory Visit: Payer: Self-pay

## 2018-10-26 ENCOUNTER — Other Ambulatory Visit: Payer: Self-pay | Admitting: Nurse Practitioner

## 2018-10-26 DIAGNOSIS — F419 Anxiety disorder, unspecified: Secondary | ICD-10-CM

## 2019-01-08 ENCOUNTER — Ambulatory Visit: Payer: BC Managed Care – PPO | Admitting: Family Medicine

## 2019-03-25 ENCOUNTER — Telehealth: Payer: Self-pay | Admitting: Family Medicine

## 2019-03-25 DIAGNOSIS — F419 Anxiety disorder, unspecified: Secondary | ICD-10-CM

## 2019-03-25 DIAGNOSIS — I10 Essential (primary) hypertension: Secondary | ICD-10-CM

## 2019-03-25 MED ORDER — LOSARTAN POTASSIUM 100 MG PO TABS: 100.0000 mg | ORAL_TABLET | Freq: Every day | ORAL | 0 refills | Status: DC

## 2019-03-25 MED ORDER — SERTRALINE HCL 25 MG PO TABS: 25.0000 mg | ORAL_TABLET | Freq: Every day | ORAL | 0 refills | Status: DC

## 2019-03-25 NOTE — Telephone Encounter (Signed)
Appt canceled in March with reason stating "moved" - if patient has moved, please have him obtain refills from new PCP.  I did send in #15 tablets of requested meds in case he needed an emergency supply.  He either needs to schedule an appt with [place] in the next 2 weeks or see his new PCP. Thanks!

## 2019-03-25 NOTE — Telephone Encounter (Signed)
Copied from [place] [id number]. Topic: Quick Communication - Rx Refill/Question >> Mar 25, 2019  9:17 AM Waylan Rocher, Lumin L wrote: Medication: losartan (COZAAR) 100 MG tablet, sertraline (ZOLOFT) 25 MG tablet   Has the patient contacted their pharmacy? yes  (Agent: If no, request that the patient contact the pharmacy for the refill.) (Agent: If yes, when and what did the pharmacy advise?)  Preferred Pharmacy (with phone number or street name): [id number] - [address], [address] [place] [address] Phone: [phone] Fax: [phone]  Agent: Please be advised that RX refills may take up to 3 business days. We ask that you follow-up with your pharmacy.

## 2019-03-26 NOTE — Telephone Encounter (Signed)
lvm asking pt to return call

## 2019-04-01 ENCOUNTER — Encounter: Payer: Self-pay | Admitting: Nurse Practitioner

## 2019-04-02 ENCOUNTER — Ambulatory Visit (INDEPENDENT_AMBULATORY_CARE_PROVIDER_SITE_OTHER): Payer: BC Managed Care – PPO | Admitting: Nurse Practitioner

## 2019-04-02 ENCOUNTER — Encounter: Payer: Self-pay | Admitting: Nurse Practitioner

## 2019-04-02 VITALS — BP 145/83 | HR 65 | Ht 72.0 in | Wt 182.0 lb

## 2019-04-02 DIAGNOSIS — E785 Hyperlipidemia, unspecified: Secondary | ICD-10-CM

## 2019-04-02 DIAGNOSIS — N400 Enlarged prostate without lower urinary tract symptoms: Secondary | ICD-10-CM

## 2019-04-02 DIAGNOSIS — R7301 Impaired fasting glucose: Secondary | ICD-10-CM

## 2019-04-02 DIAGNOSIS — M8580 Other specified disorders of bone density and structure, unspecified site: Secondary | ICD-10-CM

## 2019-04-02 DIAGNOSIS — I10 Essential (primary) hypertension: Secondary | ICD-10-CM | POA: Diagnosis not present

## 2019-04-02 DIAGNOSIS — K219 Gastro-esophageal reflux disease without esophagitis: Secondary | ICD-10-CM

## 2019-04-02 DIAGNOSIS — F419 Anxiety disorder, unspecified: Secondary | ICD-10-CM

## 2019-04-02 MED ORDER — ATORVASTATIN CALCIUM 40 MG PO TABS: 40.0000 mg | ORAL_TABLET | Freq: Every day | ORAL | 1 refills | Status: AC

## 2019-04-02 MED ORDER — LOSARTAN POTASSIUM 100 MG PO TABS: 100.0000 mg | ORAL_TABLET | Freq: Every day | ORAL | 1 refills | Status: DC

## 2019-04-02 MED ORDER — SERTRALINE HCL 25 MG PO TABS: 25.0000 mg | ORAL_TABLET | Freq: Every day | ORAL | 1 refills | Status: DC
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
## 2019-04-05 ENCOUNTER — Other Ambulatory Visit: Payer: Self-pay | Admitting: Family Medicine

## 2019-04-05 DIAGNOSIS — I10 Essential (primary) hypertension: Secondary | ICD-10-CM

## 2019-04-15 ENCOUNTER — Other Ambulatory Visit: Payer: Self-pay | Admitting: Emergency Medicine

## 2019-04-15 DIAGNOSIS — I10 Essential (primary) hypertension: Secondary | ICD-10-CM

## 2019-04-15 MED ORDER — LOSARTAN POTASSIUM 100 MG PO TABS: 100.0000 mg | ORAL_TABLET | Freq: Every day | ORAL | 1 refills | Status: DC

## 2019-04-30 ENCOUNTER — Other Ambulatory Visit: Payer: Self-pay | Admitting: Family Medicine

## 2019-04-30 DIAGNOSIS — F419 Anxiety disorder, unspecified: Secondary | ICD-10-CM

## 2019-09-17 ENCOUNTER — Other Ambulatory Visit: Payer: Self-pay

## 2019-09-17 ENCOUNTER — Encounter: Payer: Self-pay | Admitting: Family Medicine

## 2019-09-17 ENCOUNTER — Ambulatory Visit (INDEPENDENT_AMBULATORY_CARE_PROVIDER_SITE_OTHER): Payer: BC Managed Care – PPO | Admitting: Family Medicine

## 2019-09-17 VITALS — BP 137/82 | Ht 72.0 in | Wt 178.6 lb

## 2019-09-17 DIAGNOSIS — E785 Hyperlipidemia, unspecified: Secondary | ICD-10-CM

## 2019-09-17 DIAGNOSIS — F17200 Nicotine dependence, unspecified, uncomplicated: Secondary | ICD-10-CM | POA: Diagnosis not present

## 2019-09-17 DIAGNOSIS — Z720 Tobacco use: Secondary | ICD-10-CM | POA: Diagnosis not present

## 2019-09-17 DIAGNOSIS — I10 Essential (primary) hypertension: Secondary | ICD-10-CM | POA: Diagnosis not present

## 2019-09-17 DIAGNOSIS — M8580 Other specified disorders of bone density and structure, unspecified site: Secondary | ICD-10-CM

## 2019-09-17 DIAGNOSIS — Z5181 Encounter for therapeutic drug level monitoring: Secondary | ICD-10-CM

## 2019-09-17 DIAGNOSIS — F419 Anxiety disorder, unspecified: Secondary | ICD-10-CM

## 2019-09-17 MED ORDER — LOSARTAN POTASSIUM 100 MG PO TABS: 100.0000 mg | ORAL_TABLET | Freq: Every day | ORAL | 0 refills | Status: AC

## 2019-09-17 MED ORDER — NICOTINE 21 MG/24HR TD PT24: 21.0000 mg | MEDICATED_PATCH | Freq: Every day | TRANSDERMAL | 0 refills | Status: AC
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
## 2019-12-03 ENCOUNTER — Other Ambulatory Visit: Payer: Self-pay

## 2019-12-03 DIAGNOSIS — F419 Anxiety disorder, unspecified: Secondary | ICD-10-CM

## 2019-12-03 MED ORDER — SERTRALINE HCL 25 MG PO TABS: 25.0000 mg | ORAL_TABLET | Freq: Every day | ORAL | 1 refills | Status: DC

## 2020-05-25 ENCOUNTER — Other Ambulatory Visit: Payer: Self-pay | Admitting: Family Medicine

## 2020-05-25 DIAGNOSIS — F419 Anxiety disorder, unspecified: Secondary | ICD-10-CM

## 2020-05-25 NOTE — Telephone Encounter (Signed)
Lft message to sch appt for med rills per nurse

## 2020-05-25 NOTE — Telephone Encounter (Signed)
Pt needs f/u appt for refills 

## 2020-05-30 NOTE — Telephone Encounter (Signed)
Lvm informing that prescription has been sent to pharmacy. Also to ask pt to schedule appt

## 2020-06-06 ENCOUNTER — Telehealth: Payer: Self-pay

## 2020-06-06 NOTE — Telephone Encounter (Signed)
[place] I will take [place] name off his doctor name list

## 2020-06-06 NOTE — Telephone Encounter (Signed)
Pt needs f/u for refills
